# Patient Record
Sex: Male | Born: 1960
Health system: Southern US, Community
[De-identification: ages and names within clinical notes are randomized; demographics above are authoritative.]

## PROBLEM LIST (undated history)

## (undated) DIAGNOSIS — G510 Bell's palsy: Secondary | ICD-10-CM

## (undated) DIAGNOSIS — I10 Essential (primary) hypertension: Secondary | ICD-10-CM

## (undated) DIAGNOSIS — I219 Acute myocardial infarction, unspecified: Secondary | ICD-10-CM

## (undated) DIAGNOSIS — I2510 Atherosclerotic heart disease of native coronary artery without angina pectoris: Secondary | ICD-10-CM

## (undated) HISTORY — DX: Atherosclerotic heart disease of native coronary artery without angina pectoris: I25.10

## (undated) HISTORY — DX: Acute myocardial infarction, unspecified: I21.9

## (undated) HISTORY — PX: WISDOM TOOTH EXTRACTION: SHX21

---
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
## 1993-08-22 LAB — HM COLONOSCOPY

## 1999-07-19 ENCOUNTER — Ambulatory Visit (HOSPITAL_BASED_OUTPATIENT_CLINIC_OR_DEPARTMENT_OTHER): Admission: RE | Admit: 1999-07-19 | Discharge: 1999-07-19 | Payer: Self-pay | Admitting: General Surgery

## 1999-07-19 ENCOUNTER — Encounter (INDEPENDENT_AMBULATORY_CARE_PROVIDER_SITE_OTHER): Payer: Self-pay | Admitting: *Deleted

## 1999-08-23 HISTORY — PX: INGUINAL HERNIA REPAIR: SUR1180

## 2001-08-22 DIAGNOSIS — G510 Bell's palsy: Secondary | ICD-10-CM

## 2001-08-22 HISTORY — DX: Bell's palsy: G51.0

## 2008-09-11 ENCOUNTER — Encounter: Admission: RE | Admit: 2008-09-11 | Discharge: 2008-09-11 | Payer: Self-pay | Admitting: Family Medicine

## 2011-01-07 NOTE — Op Note (Signed)
[facility]  Patient:    ROBERTA KELLY                          MRN: [id number] Proc. Date: 07/19/99 Adm. Date:  [id number] Attending:  Brandy Hale CC:         Redmond Baseman, M.D.                           Operative Report  PREOPERATIVE DIAGNOSIS:  Right inguinal hernia.  POSTOPERATIVE DIAGNOSIS:  Right inguinal hernia.  OPERATION:  Repair right inguinal hernia with mesh (Lichteinstein repair).  SURGEON:  Angelia Mould. Derrell Lolling, M.D.  INDICATION:  This is a 50 year old white male who presents with a two-month history of some discomfort and a small bulge in his right groin.  Examination reveals a  small reducible right inguinal hernia and no other abnormalities.  There is no scrotal mass and there is no mass on the left inguinal area.  He is brought to he operating room for repair of his right inguinal hernia.  DESCRIPTION OF PROCEDURE:  The patient was placed supine on the operating table. The right groin was prepped and draped in a sterile fashion.  The patient was monitored and sedated by the anesthesia department.  Then 1% Xylocaine with epinephrine was used as a local infiltration anesthetic.  A transverse incision was made in the right inguinal area.  Dissection was carried down through the subcutaneous tissue exposing the aponeurosis of the external oblique.  The external oblique was incised in the direction of its fibers, opening up the external ring.  The external oblique was retracted.  The cord structures  were mobilized and encircled with a Penrose drain.  I dissected out a large lipoma of the cord and dissected this all the way back o the internal ring.  We skeletonized the lipoma somewhat, clamped the lipoma at he level of the internal ring, and amputated the lipoma.  We ligated the stump of he lipoma with 3-0 Vicryl tie.  We then dissected the cord structures away from what appears to be  a moderate-sized direct inguinal hernia.  We then examined very carefully for indirect hernia. There was no evidence of an indirect hernia.  In fact, with retraction, we actually saw the edge of the peritoneum below the level of the internal ring.  The floor of the inguinal canal was repaired and reinforced with an onlay graft of Marlex mesh.  The mesh was cut into and appropriate shape and sutured in place ith running sutures of 2-0 Prolene.  The mesh was sutured so as to generously overlap the fascia at the pubic tubercle, along the inguinal ligament inferiorly, and then along the conjoined tendons superiorly and then the mesh was incised laterally o as to wrap around the cord structures at the internal ring.  The tails of the mesh were overlapped laterally and the suture lines completed.  This provided a very secure repair both medial and lateral to the internal ring but allowed an adequate opening for the cord structures.  The wound was irrigated with saline.  Hemostasis was excellent.  The external oblique was closed with a running suture of 2-0 Vicryl placing the cord structures and the mesh deep to the external oblique.  The Scarpas fascia closed with 3-0 Vicryl and the skin closed with a running subcuticular suture of 4-0 Vicryl and Steri-Strips.  Clean bandages were  placed and the patient was taken to the recovery room in stable condition.  ESTIMATED BLOOD LOSS:  About 10 cc.  COMPLICATIONS:  None.  SPONGE/NEEDLE/INSTRUMENT COUNTS:  Correct.DD:  07/20/99 TD:  07/20/99 Job: [id number]

## 2012-07-13 ENCOUNTER — Encounter (HOSPITAL_BASED_OUTPATIENT_CLINIC_OR_DEPARTMENT_OTHER): Payer: Self-pay | Admitting: *Deleted

## 2012-07-13 ENCOUNTER — Emergency Department (HOSPITAL_BASED_OUTPATIENT_CLINIC_OR_DEPARTMENT_OTHER): Payer: No Typology Code available for payment source

## 2012-07-13 ENCOUNTER — Emergency Department (HOSPITAL_BASED_OUTPATIENT_CLINIC_OR_DEPARTMENT_OTHER)
Admission: EM | Admit: 2012-07-13 | Discharge: 2012-07-13 | Disposition: A | Discharge status: Home / Self Care | Payer: No Typology Code available for payment source | Attending: Emergency Medicine | Admitting: Emergency Medicine

## 2012-07-13 DIAGNOSIS — Y9389 Activity, other specified: Secondary | ICD-10-CM | POA: Insufficient documentation

## 2012-07-13 DIAGNOSIS — Y92410 Unspecified street and highway as the place of occurrence of the external cause: Secondary | ICD-10-CM | POA: Insufficient documentation

## 2012-07-13 DIAGNOSIS — S99929A Unspecified injury of unspecified foot, initial encounter: Secondary | ICD-10-CM | POA: Insufficient documentation

## 2012-07-13 DIAGNOSIS — I10 Essential (primary) hypertension: Secondary | ICD-10-CM | POA: Insufficient documentation

## 2012-07-13 DIAGNOSIS — S8990XA Unspecified injury of unspecified lower leg, initial encounter: Secondary | ICD-10-CM | POA: Insufficient documentation

## 2012-07-13 DIAGNOSIS — G510 Bell's palsy: Secondary | ICD-10-CM | POA: Insufficient documentation

## 2012-07-13 DIAGNOSIS — S0990XA Unspecified injury of head, initial encounter: Secondary | ICD-10-CM | POA: Insufficient documentation

## 2012-07-13 DIAGNOSIS — S161XXA Strain of muscle, fascia and tendon at neck level, initial encounter: Secondary | ICD-10-CM

## 2012-07-13 DIAGNOSIS — S139XXA Sprain of joints and ligaments of unspecified parts of neck, initial encounter: Secondary | ICD-10-CM | POA: Insufficient documentation

## 2012-07-13 HISTORY — DX: Essential (primary) hypertension: I10

## 2012-07-13 HISTORY — DX: Bell's palsy: G51.0

## 2012-07-13 MED ORDER — IBUPROFEN 800 MG PO TABS: 800.0000 mg | ORAL_TABLET | Freq: Three times a day (TID) | ORAL | Status: DC
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
## 2012-07-13 NOTE — ED Provider Notes (Signed)
Medical screening examination/treatment/procedure(s) were performed by non-physician practitioner and as supervising physician I was immediately available for consultation/collaboration.  Raeford Razor, MD 07/13/12 2004

## 2013-05-08 ENCOUNTER — Encounter: Payer: Self-pay | Admitting: Family Medicine

## 2013-05-08 ENCOUNTER — Ambulatory Visit (INDEPENDENT_AMBULATORY_CARE_PROVIDER_SITE_OTHER): Payer: BC Managed Care – PPO | Admitting: Family Medicine

## 2013-05-08 VITALS — BP 117/70 | HR 57 | Resp 16 | Ht 71.5 in | Wt 202.0 lb

## 2013-05-08 DIAGNOSIS — Z789 Other specified health status: Secondary | ICD-10-CM

## 2013-05-08 DIAGNOSIS — Z0000 Encounter for general adult medical examination without abnormal findings: Secondary | ICD-10-CM

## 2013-05-08 DIAGNOSIS — I10 Essential (primary) hypertension: Secondary | ICD-10-CM

## 2013-05-08 DIAGNOSIS — Z125 Encounter for screening for malignant neoplasm of prostate: Secondary | ICD-10-CM

## 2013-05-08 MED ORDER — LISINOPRIL 10 MG PO TABS: 10.0000 mg | ORAL_TABLET | Freq: Every day | ORAL | Status: DC

## 2013-05-08 NOTE — Progress Notes (Signed)
  Subjective:    Patient ID: Maurice Hernandez, male    DOB: Oct 16, 1960, 52 y.o.   MRN: [id number]  HPI  Maurice Hernandez is here today to establish care with our practice.  He found our name at the [facility].  He has been receiving care from Dr. Zachery Dauer at [facility].  He has been somewhat satisfied with his care and has decided to change providers.  He does not have any medical concerns today.     Review of Systems  Constitutional: Negative.   HENT: Negative.   Eyes: Negative.   Respiratory: Positive for cough.   Cardiovascular: Negative.   Gastrointestinal: Negative.   Endocrine: Negative.   Genitourinary: Negative.   Musculoskeletal: Negative.   Skin: Negative.   Allergic/Immunologic: Negative.   Neurological: Negative.   Hematological: Negative.   Psychiatric/Behavioral: Negative.     Past Medical History  Diagnosis Date  . Hypertension   . Bell's palsy 2003    Family History  Problem Relation Age of Onset  . COPD Mother     Non Smoker (16)   . Hypertension Mother   . Heart disease Father     CABG (3-4 vessel)   . Hypertension Sister   . Alzheimer's disease Paternal Aunt   . Alzheimer's disease Paternal Uncle   . Cancer Sister     Hodgkin's Disease (16); Breast Cancer (Bilateral Mastectomy)   . 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 Constitutional: He is oriented to person, place, and time. He appears well-developed and well-nourished.  HENT:  Head: Normocephalic.  Eyes: Conjunctivae  are normal. No scleral icterus.  Neck: Neck supple. No thyromegaly present.  Cardiovascular: Normal rate, regular rhythm and normal heart sounds.   Pulmonary/Chest: Effort normal and breath sounds normal.  Musculoskeletal: He exhibits no edema and no tenderness.  Lymphadenopathy:    He has no cervical adenopathy.  Neurological: He is alert and oriented to person, place, and time.  Skin: Skin is warm and dry. No rash noted.  Psychiatric: He has a normal mood and affect. His behavior is normal. Judgment and thought content normal.      Assessment & Plan:

## 2013-05-08 NOTE — Patient Instructions (Addendum)
1)  BP - Perfect on current medication.  If you feel that you cough too much we can always change from an ACE to ARB.   2)  Vaccinations - Flu, Tdap, Zostavax vs Varicella  3)  Labs - Next week then set up a CPE.    Preventive Care for Adults, Male A healthy lifestyle and preventive care can promote health and wellness. Preventive health guidelines for men include the following key practices:  A routine yearly physical is a good way to check with your caregiver about your health and preventative screening. It is a chance to share any concerns and updates on your health, and to receive a thorough exam.  Visit your dentist for a routine exam and preventative care every 6 months. Brush your teeth twice a day and floss once a day. Good oral hygiene prevents tooth decay and gum disease.  The frequency of eye exams is based on your age, health, family medical history, use of contact lenses, and other factors. Follow your caregiver's recommendations for frequency of eye exams.  Eat a healthy diet. Foods like vegetables, fruits, whole grains, low-fat dairy products, and lean protein foods contain the nutrients you need without too many calories. Decrease your intake of foods high in solid fats, added sugars, and salt. Eat the right amount of calories for you.Get information about a proper diet from your caregiver, if necessary.  Regular physical exercise is one of the most important things you can do for your health. Most adults should get at least 150 minutes of moderate-intensity exercise (any activity that increases your heart rate and causes you to sweat) each week. In addition, most adults need muscle-strengthening exercises on 2 or more days a week.  Maintain a healthy weight. The body mass index (BMI) is a screening tool to identify possible weight problems. It provides an estimate of body fat based on height and weight. Your caregiver can help determine your BMI, and can help you achieve or  maintain a healthy weight.For adults 20 years and older:  A BMI below 18.5 is considered underweight.  A BMI of 18.5 to 24.9 is normal.  A BMI of 25 to 29.9 is considered overweight.  A BMI of 30 and above is considered obese.  Maintain normal blood lipids and cholesterol levels by exercising and minimizing your intake of saturated fat. Eat a balanced diet with plenty of fruit and vegetables. Blood tests for lipids and cholesterol should begin at age 62 and be repeated every 5 years. If your lipid or cholesterol levels are high, you are over 50, or you are a high risk for heart disease, you may need your cholesterol levels checked more frequently.Ongoing high lipid and cholesterol levels should be treated with medicines if diet and exercise are not effective.  If you smoke, find out from your caregiver how to quit. If you do not use tobacco, do not start.  If you choose to drink alcohol, do not exceed 2 drinks per day. One drink is considered to be 12 ounces (355 mL) of beer, 5 ounces (148 mL) of wine, or 1.5 ounces (44 mL) of liquor.  Avoid use of street drugs. Do not share needles with anyone. Ask for help if you need support or instructions about stopping the use of drugs.  High blood pressure causes heart disease and increases the risk of stroke. Your blood pressure should be checked at least every 1 to 2 years. Ongoing high blood pressure should be treated with medicines,  if weight loss and exercise are not effective.  If you are 11 to 50 years old, ask your caregiver if you should take aspirin to prevent heart disease.  Diabetes screening involves taking a blood sample to check your fasting blood sugar level. This should be done once every 3 years, after age 103, if you are within normal weight and without risk factors for diabetes. Testing should be considered at a younger age or be carried out more frequently if you are overweight and have at least 1 risk factor for  diabetes.  Colorectal cancer can be detected and often prevented. Most routine colorectal cancer screening begins at the age of 70 and continues through age 68. However, your caregiver may recommend screening at an earlier age if you have risk factors for colon cancer. On a yearly basis, your caregiver may provide home test kits to check for hidden blood in the stool. Use of a small camera at the end of a tube, to directly examine the colon (sigmoidoscopy or colonoscopy), can detect the earliest forms of colorectal cancer. Talk to your caregiver about this at age 21, when routine screening begins. Direct examination of the colon should be repeated every 5 to 10 years through age 35, unless early forms of pre-cancerous polyps or small growths are found.  Hepatitis C blood testing is recommended for all people born from [birth year] through 1965 and any individual with known risks for hepatitis C.  Practice safe sex. Use condoms and avoid high-risk sexual practices to reduce the spread of sexually transmitted infections (STIs). STIs include gonorrhea, chlamydia, syphilis, trichomonas, herpes, HPV, and human immunodeficiency virus (HIV). Herpes, HIV, and HPV are viral illnesses that have no cure. They can result in disability, cancer, and death.  A one-time screening for abdominal aortic aneurysm (AAA) and surgical repair of large AAAs by sound wave imaging (ultrasonography) is recommended for ages 54 to 16 years who are current or former smokers.  Healthy men should no longer receive prostate-specific antigen (PSA) blood tests as part of routine cancer screening. Consult with your caregiver about prostate cancer screening.  Testicular cancer screening is not recommended for adult males who have no symptoms. Screening includes self-exam, caregiver exam, and other screening tests. Consult with your caregiver about any symptoms you have or any concerns you have about testicular cancer.  Use sunscreen with skin  protection factor (SPF) of 30 or more. Apply sunscreen liberally and repeatedly throughout the day. You should seek shade when your shadow is shorter than you. Protect yourself by wearing long sleeves, pants, a wide-brimmed hat, and sunglasses year round, whenever you are outdoors.  Once a month, do a whole body skin exam, using a mirror to look at the skin on your back. Notify your caregiver of new moles, moles that have irregular borders, moles that are larger than a pencil eraser, or moles that have changed in shape or color.  Stay current with required immunizations.  Influenza. You need a dose every fall (or winter). The composition of the flu vaccine changes each year, so being vaccinated once is not enough.  Pneumococcal polysaccharide. You need 1 to 2 doses if you smoke cigarettes or if you have certain chronic medical conditions. You need 1 dose at age 18 (or older) if you have never been vaccinated.  Tetanus, diphtheria, pertussis (Tdap, Td). Get 1 dose of Tdap vaccine if you are younger than age 25 years, are over 12 and have contact with an infant, are a Research scientist (physical sciences),  or simply want to be protected from whooping cough. After that, you need a Td booster dose every 10 years. Consult your caregiver if you have not had at least 3 tetanus and diphtheria-containing shots sometime in your life or have a deep or dirty wound.  HPV. This vaccine is recommended for males 13 through 50 years of age. This vaccine may be given to men 22 through 50 years of age who have not completed the 3 dose series. It is recommended for men through age 48 who have sex with men or whose immune system is weakened because of HIV infection, other illness, or medications. The vaccine is given in 3 doses over 6 months.  Measles, mumps, rubella (MMR). You need at least 1 dose of MMR if you were born in 1957 or later. You may also need a 2nd dose.  Meningococcal. If you are age 104 to 68 years and a Dealer living in a residence hall, or have one of several medical conditions, you need to get vaccinated against meningococcal disease. You may also need additional booster doses.  Zoster (shingles). If you are age 64 years or older, you should get this vaccine.  Varicella (chickenpox). If you have never had chickenpox or you were vaccinated but received only 1 dose, talk to your caregiver to find out if you need this vaccine.  Hepatitis A. You need this vaccine if you have a specific risk factor for hepatitis A virus infection, or you simply wish to be protected from this disease. The vaccine is usually given as 2 doses, 6 to 18 months apart.  Hepatitis B. You need this vaccine if you have a specific risk factor for hepatitis B virus infection or you simply wish to be protected from this disease. The vaccine is given in 3 doses, usually over 6 months. Preventative Service / Frequency Ages 87 to 67  Blood pressure check.** / Every 1 to 2 years.  Lipid and cholesterol check.** / Every 5 years beginning at age 13.  Hepatitis C blood test.** / For any individual with known risks for hepatitis C.  Skin self-exam. / Monthly.  Influenza immunization.** / Every year.  Pneumococcal polysaccharide immunization.** / 1 to 2 doses if you smoke cigarettes or if you have certain chronic medical conditions.  Tetanus, diphtheria, pertussis (Tdap,Td) immunization. / A one-time dose of Tdap vaccine. After that, you need a Td booster dose every 10 years.  HPV immunization. / 3 doses over 6 months, if 26 and younger.  Measles, mumps, rubella (MMR) immunization. / You need at least 1 dose of MMR if you were born in 1957 or later. You may also need a 2nd dose.  Meningococcal immunization. / 1 dose if you are age 41 to 26 years and a Orthoptist living in a residence hall, or have one of several medical conditions, you need to get vaccinated against meningococcal disease. You may also need  additional booster doses.  Varicella immunization.** / Consult your caregiver.  Hepatitis A immunization.** / Consult your caregiver. 2 doses, 6 to 18 months apart.  Hepatitis B immunization.** / Consult your caregiver. 3 doses usually over 6 months. Ages 74 to 25  Blood pressure check.** / Every 1 to 2 years.  Lipid and cholesterol check.** / Every 5 years beginning at age 54.  Fecal occult blood test (FOBT) of stool. / Every year beginning at age 72 and continuing until age 18. You may not have to do this test  if you get colonoscopy every 10 years.  Flexible sigmoidoscopy** or colonoscopy.** / Every 5 years for a flexible sigmoidoscopy or every 10 years for a colonoscopy beginning at age 62 and continuing until age 49.  Hepatitis C blood test.** / For all people born from [birth year] through 1965 and any individual with known risks for hepatitis C.  Skin self-exam. / Monthly.  Influenza immunization.** / Every year.  Pneumococcal polysaccharide immunization.** / 1 to 2 doses if you smoke cigarettes or if you have certain chronic medical conditions.  Tetanus, diphtheria, pertussis (Tdap/Td) immunization.** / A one-time dose of Tdap vaccine. After that, you need a Td booster dose every 10 years.  Measles, mumps, rubella (MMR) immunization. / You need at least 1 dose of MMR if you were born in 1957 or later. You may also need a 2nd dose.  Varicella immunization.**/ Consult your caregiver.  Meningococcal immunization.** / Consult your caregiver.  Hepatitis A immunization.** / Consult your caregiver. 2 doses, 6 to 18 months apart.  Hepatitis B immunization.** / Consult your caregiver. 3 doses, usually over 6 months. Ages 16 and over  Blood pressure check.** / Every 1 to 2 years.  Lipid and cholesterol check.**/ Every 5 years beginning at age 55.  Fecal occult blood test (FOBT) of stool. / Every year beginning at age 38 and continuing until age 8. You may not have to do this test if  you get colonoscopy every 10 years.  Flexible sigmoidoscopy** or colonoscopy.** / Every 5 years for a flexible sigmoidoscopy or every 10 years for a colonoscopy beginning at age 25 and continuing until age 32.  Hepatitis C blood test.** / For all people born from [birth year] through 1963 and any individual with known risks for hepatitis C.  Abdominal aortic aneurysm (AAA) screening.** / A one-time screening for ages 67 to 45 years who are current or former smokers.  Skin self-exam. / Monthly.  Influenza immunization.** / Every year.  Pneumococcal polysaccharide immunization.** / 1 dose at age 24 (or older) if you have never been vaccinated.  Tetanus, diphtheria, pertussis (Tdap, Td) immunization. / A one-time dose of Tdap vaccine if you are over 65 and have contact with an infant, are a Research scientist (physical sciences), or simply want to be protected from whooping cough. After that, you need a Td booster dose every 10 years.  Varicella immunization. ** / Consult your caregiver.  Meningococcal immunization.** / Consult your caregiver.  Hepatitis A immunization. ** / Consult your caregiver. 2 doses, 6 to 18 months apart.  Hepatitis B immunization.** / Check with your caregiver. 3 doses, usually over 6 months. **Family history and personal history of risk and condi  tions may change your caregiver's recommendations. Document Released: 10/04/2001 Document Revised: 10/31/2011 Document Reviewed: 01/03/2011 [facility] Patient Information 2014 [place], [place].

## 2013-05-17 ENCOUNTER — Other Ambulatory Visit: Payer: Self-pay | Admitting: *Deleted

## 2013-05-17 DIAGNOSIS — Z789 Other specified health status: Secondary | ICD-10-CM

## 2013-05-17 DIAGNOSIS — Z0000 Encounter for general adult medical examination without abnormal findings: Secondary | ICD-10-CM

## 2013-05-17 DIAGNOSIS — Z125 Encounter for screening for malignant neoplasm of prostate: Secondary | ICD-10-CM

## 2013-05-17 LAB — COMPLETE METABOLIC PANEL WITH GFR
ALT: 19 U/L (ref 0–53)
AST: 19 U/L (ref 0–37)
Albumin: 4.6 g/dL (ref 3.5–5.2)
Alkaline Phosphatase: 61 U/L (ref 39–117)
BUN: 21 mg/dL (ref 6–23)
CO2: 30 mEq/L (ref 19–32)
Calcium: 9.6 mg/dL (ref 8.4–10.5)
Chloride: 104 mEq/L (ref 96–112)
Creat: 1.13 mg/dL (ref 0.50–1.35)
GFR, Est African American: 87 mL/min
GFR, Est Non African American: 75 mL/min
Glucose, Bld: 89 mg/dL (ref 70–99)
Potassium: 4.3 mEq/L (ref 3.5–5.3)
Sodium: 139 mEq/L (ref 135–145)
Total Bilirubin: 0.8 mg/dL (ref 0.3–1.2)
Total Protein: 6.8 g/dL (ref 6.0–8.3)

## 2013-05-17 LAB — LIPID PANEL
Cholesterol: 213 mg/dL — ABNORMAL HIGH (ref 0–200)
HDL: 42 mg/dL (ref >39)
LDL Cholesterol: 147 mg/dL — ABNORMAL HIGH (ref 0–99)
Total CHOL/HDL Ratio: 5.1 Ratio
Triglycerides: 120 mg/dL (ref <150)
VLDL: 24 mg/dL (ref 0–40)

## 2013-05-17 LAB — PSA, TOTAL AND FREE
PSA, Free Pct: 35 % (ref >25)
PSA, Free: 0.13 ng/mL
PSA: 0.37 ng/mL (ref <=4.00)

## 2013-05-17 LAB — TSH: TSH: 2.34 u[IU]/mL (ref 0.350–4.500)

## 2013-05-18 LAB — VARICELLA ZOSTER ANTIBODY, IGG: Varicella IgG: 1923 Index — ABNORMAL HIGH (ref <135.00)

## 2013-06-06 ENCOUNTER — Encounter: Payer: BC Managed Care – PPO | Admitting: Family Medicine

## 2013-06-20 ENCOUNTER — Ambulatory Visit (INDEPENDENT_AMBULATORY_CARE_PROVIDER_SITE_OTHER): Payer: BC Managed Care – PPO | Admitting: Family Medicine

## 2013-06-20 ENCOUNTER — Encounter: Payer: Self-pay | Admitting: Family Medicine

## 2013-06-20 VITALS — BP 135/81 | HR 69 | Resp 16 | Ht 72.0 in | Wt 200.0 lb

## 2013-06-20 DIAGNOSIS — R5381 Other malaise: Secondary | ICD-10-CM

## 2013-06-20 DIAGNOSIS — I10 Essential (primary) hypertension: Secondary | ICD-10-CM

## 2013-06-20 DIAGNOSIS — E785 Hyperlipidemia, unspecified: Secondary | ICD-10-CM

## 2013-06-20 DIAGNOSIS — N529 Male erectile dysfunction, unspecified: Secondary | ICD-10-CM

## 2013-06-20 DIAGNOSIS — Z0000 Encounter for general adult medical examination without abnormal findings: Secondary | ICD-10-CM

## 2013-06-20 LAB — POCT URINALYSIS DIPSTICK
Bilirubin, UA: NEGATIVE
Blood, UA: NEGATIVE
Glucose, UA: NEGATIVE
Ketones, UA: NEGATIVE
Leukocytes, UA: NEGATIVE
Nitrite, UA: NEGATIVE
Protein, UA: NEGATIVE
Spec Grav, UA: 1.015
Urobilinogen, UA: NEGATIVE
pH, UA: 5

## 2013-06-20 LAB — EKG 12-LEAD

## 2013-06-20 MED ORDER — TADALAFIL 20 MG PO TABS: 20.0000 mg | ORAL_TABLET | Freq: Every day | ORAL | Status: DC | PRN

## 2013-06-20 MED ORDER — SILDENAFIL CITRATE 100 MG PO TABS: ORAL_TABLET | ORAL | Status: DC

## 2013-06-20 MED ORDER — LOSARTAN POTASSIUM 50 MG PO TABS: 50.0000 mg | ORAL_TABLET | Freq: Every day | ORAL | Status: DC

## 2013-06-20 MED ORDER — VARDENAFIL HCL 20 MG PO TABS: ORAL_TABLET | ORAL | Status: DC

## 2013-06-20 NOTE — Progress Notes (Signed)
Subjective:    Patient ID: Maurice Hernandez, male    DOB: 02/21/1961, 52 y.o.   MRN: [id number]  HPI  Maurice Hernandez is here today for his annual CPE.  Overall he feels that he is healthy. He would like to go over his most recent lab results and discuss a couple of conditions.     Review of Systems  Constitutional: Negative.   HENT: Negative.   Eyes: Negative.   Respiratory: Negative.   Cardiovascular: Negative.   Gastrointestinal: Negative.   Endocrine: Negative.   Genitourinary: Negative.   Musculoskeletal: Negative.   Skin: Negative.   Allergic/Immunologic: Negative.   Neurological: Negative.   Hematological: Negative.   Psychiatric/Behavioral: Negative.      Past Medical History  Diagnosis Date  . Hypertension   . Bell's palsy 2003      Family History  Problem Relation Age of Onset  . COPD Mother     Non Smoker (110)   . Hypertension Mother   . Heart disease Father     CABG (3-4 vessel)   . Hypertension Sister   . Alzheimer's disease Paternal Aunt   . Alzheimer's disease Paternal Uncle   . Cancer Sister     Hodgkin's Disease (16); Breast Cancer (Bilateral Mastectomy)   . 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 Constitutional: He is oriented to person, place, and time. He appears well-developed and well-nourished.  HENT:  Head: Normocephalic.  Eyes: Conjunctivae and EOM are normal. Pupils are equal, round, and reactive to light.  Neck: Normal range of motion.   Cardiovascular: Normal rate, normal heart sounds and intact distal pulses.   Pulmonary/Chest: Effort normal and breath sounds normal.  Abdominal: Soft. Bowel sounds are normal.  Genitourinary: Penis normal.  Musculoskeletal: Normal range of motion.  Neurological: He is alert and oriented to person, place, and time.  Skin: Skin is warm and dry.  Psychiatric: He has a normal mood and affect. His behavior is normal. Judgment and thought content normal.      Assessment & Plan:    Brahm was seen today for annual exam.  Diagnoses and associated orders for this visit:  Routine general medical examination at a health care facility Comments: We discussed preventative issues for his age.  He is to let us know if he wants to proceed with a colonoscopy.  We checked to see if he has had chicken pox hich he has so he'll check to see if his insurance covers the Zostavax.   - POCT urinalysis dipstick  Other malaise and fatigue Comments: His TSH and CBC were WNL.    Erectile dysfunction Comments: He is going to try several different medications for ED.   - sildenafil (VIAGRA) 100 MG  tablet; Take 1/2 - 1 tab as needed - vardenafil (LEVITRA) 20 MG tablet; Take 1/2 - 1 tab as needed - tadalafil (CIALIS) 20 MG tablet; Take 1 tablet (20 mg total) by mouth daily as needed for erectile dysfunction.  Essential hypertension, benign Comments: We'll change him from lisinopril to losartan to see if this not only helps get rid of his cough but help lessen his issue with ED as well.   - EKG 12-Lead - losartan (COZAAR) 50 MG tablet; Take 1 tablet (50 mg total) by mouth daily.  Other and unspecified hyperlipidemia Comments: He is going to work harder on his diet and exercise.

## 2013-06-20 NOTE — Patient Instructions (Addendum)
1)  Vaccinations - Check into getting the Tdap and Zostavax.    2)  Preventative - Colonoscopy    Preventive Care for Adults, Male A healthy lifestyle and preventive care can promote health and wellness. Preventive health guidelines for men include the following key practices:  A routine yearly physical is a good way to check with your caregiver about your health and preventative screening. It is a chance to share any concerns and updates on your health, and to receive a thorough exam.  Visit your dentist for a routine exam and preventative care every 6 months. Brush your teeth twice a day and floss once a day. Good oral hygiene prevents tooth decay and gum disease.  The frequency of eye exams is based on your age, health, family medical history, use of contact lenses, and other factors. Follow your caregiver's recommendations for frequency of eye exams.  Eat a healthy diet. Foods like vegetables, fruits, whole grains, low-fat dairy products, and lean protein foods contain the nutrients you need without too many calories. Decrease your intake of foods high in solid fats, added sugars, and salt. Eat the right amount of calories for you.Get information about a proper diet from your caregiver, if necessary.  Regular physical exercise is one of the most important things you can do for your health. Most adults should get at least 150 minutes of moderate-intensity exercise (any activity that increases your heart rate and causes you to sweat) each week. In addition, most adults need muscle-strengthening exercises on 2 or more days a week.  Maintain a healthy weight. The body mass index (BMI) is a screening tool to identify possible weight problems. It provides an estimate of body fat based on height and weight. Your caregiver can help determine your BMI, and can help you achieve or maintain a healthy weight.For adults 20 years and older:  A BMI below 18.5 is considered underweight.  A BMI of 18.5 to  24.9 is normal.  A BMI of 25 to 29.9 is considered overweight.  A BMI of 30 and above is considered obese.  Maintain normal blood lipids and cholesterol levels by exercising and minimizing your intake of saturated fat. Eat a balanced diet with plenty of fruit and vegetables. Blood tests for lipids and cholesterol should begin at age 14 and be repeated every 5 years. If your lipid or cholesterol levels are high, you are over 50, or you are a high risk for heart disease, you may need your cholesterol levels checked more frequently.Ongoing high lipid and cholesterol levels should be treated with medicines if diet and exercise are not effective.  If you smoke, find out from your caregiver how to quit. If you do not use tobacco, do not start.  If you choose to drink alcohol, do not exceed 2 drinks per day. One drink is considered to be 12 ounces (355 mL) of beer, 5 ounces (148 mL) of wine, or 1.5 ounces (44 mL) of liquor.  Avoid use of street drugs. Do not share needles with anyone. Ask for help if you need support or instructions about stopping the use of drugs.  High blood pressure causes heart disease and increases the risk of stroke. Your blood pressure should be checked at least every 1 to 2 years. Ongoing high blood pressure should be treated with medicines, if weight loss and exercise are not effective.  If you are 38 to 50 years old, ask your caregiver if you should take aspirin to prevent heart disease.  Diabetes screening involves taking a blood sample to check your fasting blood sugar level. This should be done once every 3 years, after age 53, if you are within normal weight and without risk factors for diabetes. Testing should be considered at a younger age or be carried out more frequently if you are overweight and have at least 1 risk factor for diabetes.  Colorectal cancer can be detected and often prevented. Most routine colorectal cancer screening begins at the age of 50 and  continues through age 38. However, your caregiver may recommend screening at an earlier age if you have risk factors for colon cancer. On a yearly basis, your caregiver may provide home test kits to check for hidden blood in the stool. Use of a small camera at the end of a tube, to directly examine the colon (sigmoidoscopy or colonoscopy), can detect the earliest forms of colorectal cancer. Talk to your caregiver about this at age 4, when routine screening begins. Direct examination of the colon should be repeated every 5 to 10 years through age 34, unless early forms of pre-cancerous polyps or small growths are found.  Hepatitis C blood testing is recommended for all people born from [birth year] through 1965 and any individual with known risks for hepatitis C.  Practice safe sex. Use condoms and avoid high-risk sexual practices to reduce the spread of sexually transmitted infections (STIs). STIs include gonorrhea, chlamydia, syphilis, trichomonas, herpes, HPV, and human immunodeficiency virus (HIV). Herpes, HIV, and HPV are viral illnesses that have no cure. They can result in disability, cancer, and death.  A one-time screening for abdominal aortic aneurysm (AAA) and surgical repair of large AAAs by sound wave imaging (ultrasonography) is recommended for ages 57 to 10 years who are current or former smokers.  Healthy men should no longer receive prostate-specific antigen (PSA) blood tests as part of routine cancer screening. Consult with your caregiver about prostate cancer screening.  Testicular cancer screening is not recommended for adult males who have no symptoms. Screening includes self-exam, caregiver exam, and other screening tests. Consult with your caregiver about any symptoms you have or any concerns you have about testicular cancer.  Use sunscreen with skin protection factor (SPF) of 30 or more. Apply sunscreen liberally and repeatedly throughout the day. You should seek shade when your shadow  is shorter than you. Protect yourself by wearing long sleeves, pants, a wide-brimmed hat, and sunglasses year round, whenever you are outdoors.  Once a month, do a whole body skin exam, using a mirror to look at the skin on your back. Notify your caregiver of new moles, moles that have irregular borders, moles that are larger than a pencil eraser, or moles that have changed in shape or color.  Stay current with required immunizations.  Influenza. You need a dose every fall (or winter). The composition of the flu vaccine changes each year, so being vaccinated once is not enough.  Pneumococcal polysaccharide. You need 1 to 2 doses if you smoke cigarettes or if you have certain chronic medical conditions. You need 1 dose at age 64 (or older) if you have never been vaccinated.  Tetanus, diphtheria, pertussis (Tdap, Td). Get 1 dose of Tdap vaccine if you are younger than age 45 years, are over 35 and have contact with an infant, are a Research scientist (physical sciences), or simply want to be protected from whooping cough. After that, you need a Td booster dose every 10 years. Consult your caregiver if you have not had at least  3 tetanus and diphtheria-containing shots sometime in your life or have a deep or dirty wound.  HPV. This vaccine is recommended for males 13 through 50 years of age. This vaccine may be given to men 22 through 50 years of age who have not completed the 3 dose series. It is recommended for men through age 62 who have sex with men or whose immune system is weakened because of HIV infection, other illness, or medications. The vaccine is given in 3 doses over 6 months.  Measles, mumps, rubella (MMR). You need at least 1 dose of MMR if you were born in 1957 or later. You may also need a 2nd dose.  Meningococcal. If you are age 52 to 65 years and a Orthoptist living in a residence hall, or have one of several medical conditions, you need to get vaccinated against meningococcal disease. You  may also need additional booster doses.  Zoster (shingles). If you are age 41 years or older, you should get this vaccine.  Varicella (chickenpox). If you have never had chickenpox or you were vaccinated but received only 1 dose, talk to your caregiver to find out if you need this vaccine.  Hepatitis A. You need this vaccine if you have a specific risk factor for hepatitis A virus infection, or you simply wish to be protected from this disease. The vaccine is usually given as 2 doses, 6 to 18 months apart.  Hepatitis B. You need this vaccine if you have a specific risk factor for hepatitis B virus infection or you simply wish to be protected from this disease. The vaccine is given in 3 doses, usually over 6 months. Preventative Service / Frequency Ages 70 to 55  Blood pressure check.** / Every 1 to 2 years.  Lipid and cholesterol check.** / Every 5 years beginning at age 40.  Hepatitis C blood test.** / For any individual with known risks for hepatitis C.  Skin self-exam. / Monthly.  Influenza immunization.** / Every year.  Pneumococcal polysaccharide immunization.** / 1 to 2 doses if you smoke cigarettes or if you have certain chronic medical conditions.  Tetanus, diphtheria, pertussis (Tdap,Td) immunization. / A one-time dose of Tdap vaccine. After that, you need a Td booster dose every 10 years.  HPV immunization. / 3 doses over 6 months, if 26 and younger.  Measles, mumps, rubella (MMR) immunization. / You need at least 1 dose of MMR if you were born in 1957 or later. You may also need a 2nd dose.  Meningococcal immunization. / 1 dose if you are age 45 to 30 years and a Orthoptist living in a residence hall, or have one of several medical conditions, you need to get vaccinated against meningococcal disease. You may also need additional booster doses.  Varicella immunization.** / Consult your caregiver.  Hepatitis A immunization.** / Consult your caregiver. 2  doses, 6 to 18 months apart.  Hepatitis B immunization.** / Consult your caregiver. 3 doses usually over 6 months. Ages 43 to 59  Blood pressure check.** / Every 1 to 2 years.  Lipid and cholesterol check.** / Every 5 years beginning at age 25.  Fecal occult blood test (FOBT) of stool. / Every year beginning at age 63 and continuing until age 9. You may not have to do this test if you get colonoscopy every 10 years.  Flexible sigmoidoscopy** or colonoscopy.** / Every 5 years for a flexible sigmoidoscopy or every 10 years for a colonoscopy beginning at age  50 and continuing until age 57.  Hepatitis C blood test.** / For all people born from [birth year] through 1965 and any individual with known risks for hepatitis C.  Skin self-exam. / Monthly.  Influenza immunization.** / Every year.  Pneumococcal polysaccharide immunization.** / 1 to 2 doses if you smoke cigarettes or if you have certain chronic medical conditions.  Tetanus, diphtheria, pertussis (Tdap/Td) immunization.** / A one-time dose of Tdap vaccine. After that, you need a Td booster dose every 10 years.  Measles, mumps, rubella (MMR) immunization. / You need at least 1 dose of MMR if you were born in 1957 or later. You may also need a 2nd dose.  Varicella immunization.**/ Consult your caregiver.  Meningococcal immunization.** / Consult your caregiver.  Hepatitis A immunization.** / Consult your caregiver. 2 doses, 6 to 18 months apart.  Hepatitis B immunization.** / Consult your caregiver. 3 doses, usually over 6 months. Ages 29 and over  Blood pressure check.** / Every 1 to 2 years.  Lipid and cholesterol check.**/ Every 5 years beginning at age 89.  Fecal occult blood test (FOBT) of stool. / Every year beginning at age 66 and continuing until age 64. You may not have to do this test if you get colonoscopy every 10 years.  Flexible sigmoidoscopy** or colonoscopy.** / Every 5 years for a flexible sigmoidoscopy or every 10  years for a colonoscopy beginning at age 59 and continuing until age 25.  Hepatitis C blood test.** / For all people born from [birth year] through 1965 and any individual with known risks for hepatitis C.  Abdominal aortic aneurysm (AAA) screening.** / A one-time screening for ages 67 to 38 years who are current or former smokers.  Skin self-exam. / Monthly.  Influenza immunization.** / Every year.  Pneumococcal polysaccharide immunization.** / 1 dose at age 59 (or older) if you have never been vaccinated.  Tetanus, diphtheria, pertussis (Tdap, Td) immunization. / A one-time dose of Tdap vaccine if you are over 65 and have contact with an infant, are a Research scientist (physical sciences), or simply want to be protected from whooping cough. After that, you need a Td booster dose every 10 years.  Varicella immunization. ** / Consult your caregiver.  Meningococcal immunization.** / Consult your caregiver.  Hepatitis A immunization. ** / Consult your caregiver. 2 doses, 6 to 18 months apart.  Hepatitis B immunization.** / Check with your caregiver. 3 doses, usually over 6 months. **Family history and personal history of risk and conditions may change your caregiver's recommendations. Document Released: 10/04/2001 Document Revised: 10/31/2011 Document Reviewed: 01/03/2011 [facility] Patient Information 2014 [place], [place].

## 2013-07-20 ENCOUNTER — Encounter: Payer: Self-pay | Admitting: Family Medicine

## 2013-07-20 DIAGNOSIS — Z0000 Encounter for general adult medical examination without abnormal findings: Secondary | ICD-10-CM | POA: Insufficient documentation

## 2013-07-20 DIAGNOSIS — Z789 Other specified health status: Secondary | ICD-10-CM | POA: Insufficient documentation

## 2013-07-20 DIAGNOSIS — Z125 Encounter for screening for malignant neoplasm of prostate: Secondary | ICD-10-CM | POA: Insufficient documentation

## 2013-07-20 DIAGNOSIS — I10 Essential (primary) hypertension: Secondary | ICD-10-CM | POA: Insufficient documentation

## 2013-07-20 NOTE — Assessment & Plan Note (Signed)
He has a mild cough which may be due to his ACE (lisinopril).  He will keep an eye on this.  We may switch him to an ARB.

## 2013-07-20 NOTE — Assessment & Plan Note (Signed)
Checking a PSA 

## 2013-07-20 NOTE — Assessment & Plan Note (Signed)
He is unsure if he has ever had chicken pox or not.  We'll check a Varicella Titer.

## 2013-07-29 ENCOUNTER — Encounter: Payer: Self-pay | Admitting: Family Medicine

## 2013-07-30 ENCOUNTER — Encounter: Payer: Self-pay | Admitting: *Deleted

## 2013-08-24 DIAGNOSIS — N529 Male erectile dysfunction, unspecified: Secondary | ICD-10-CM | POA: Insufficient documentation

## 2013-08-24 DIAGNOSIS — R5383 Other fatigue: Secondary | ICD-10-CM

## 2013-08-24 DIAGNOSIS — R5381 Other malaise: Secondary | ICD-10-CM | POA: Insufficient documentation

## 2013-08-24 NOTE — Addendum Note (Signed)
Addended by: Ival Bible on: 08/24/2013 05:49 PM   Modules accepted: Level of Service

## 2013-10-21 ENCOUNTER — Ambulatory Visit (INDEPENDENT_AMBULATORY_CARE_PROVIDER_SITE_OTHER): Payer: No Typology Code available for payment source | Admitting: *Deleted

## 2013-10-21 DIAGNOSIS — R5383 Other fatigue: Secondary | ICD-10-CM

## 2013-10-21 DIAGNOSIS — Z2911 Encounter for prophylactic immunotherapy for respiratory syncytial virus (RSV): Secondary | ICD-10-CM

## 2013-10-21 DIAGNOSIS — Z23 Encounter for immunization: Secondary | ICD-10-CM

## 2013-10-21 DIAGNOSIS — R5381 Other malaise: Secondary | ICD-10-CM

## 2013-10-21 NOTE — Assessment & Plan Note (Signed)
He received his Boostrix without difficulty.  He was given a handout discussing possible side effects.  

## 2014-02-23 ENCOUNTER — Other Ambulatory Visit: Payer: Self-pay | Admitting: Family Medicine

## 2014-02-27 ENCOUNTER — Other Ambulatory Visit: Payer: Self-pay | Admitting: *Deleted

## 2014-02-27 DIAGNOSIS — I10 Essential (primary) hypertension: Secondary | ICD-10-CM

## 2014-02-27 MED ORDER — LISINOPRIL 10 MG PO TABS: 10.0000 mg | ORAL_TABLET | Freq: Every day | ORAL | Status: DC

## 2014-03-19 ENCOUNTER — Ambulatory Visit: Payer: No Typology Code available for payment source | Admitting: Family Medicine

## 2014-05-06 ENCOUNTER — Ambulatory Visit: Payer: No Typology Code available for payment source | Admitting: Physician Assistant

## 2014-05-06 DIAGNOSIS — Z0289 Encounter for other administrative examinations: Secondary | ICD-10-CM

## 2014-05-21 ENCOUNTER — Ambulatory Visit (INDEPENDENT_AMBULATORY_CARE_PROVIDER_SITE_OTHER): Payer: No Typology Code available for payment source | Admitting: Physician Assistant

## 2014-05-21 ENCOUNTER — Encounter: Payer: Self-pay | Admitting: Physician Assistant

## 2014-05-21 VITALS — BP 127/87 | HR 55 | Temp 97.9°F | Resp 16 | Ht 72.0 in | Wt 190.5 lb

## 2014-05-21 DIAGNOSIS — Z113 Encounter for screening for infections with a predominantly sexual mode of transmission: Secondary | ICD-10-CM | POA: Diagnosis not present

## 2014-05-21 DIAGNOSIS — I10 Essential (primary) hypertension: Secondary | ICD-10-CM

## 2014-05-21 DIAGNOSIS — Z1211 Encounter for screening for malignant neoplasm of colon: Secondary | ICD-10-CM | POA: Diagnosis not present

## 2014-05-21 MED ORDER — LISINOPRIL 10 MG PO TABS: 10.0000 mg | ORAL_TABLET | Freq: Every day | ORAL | Status: DC

## 2014-05-21 NOTE — Assessment & Plan Note (Signed)
Will order comprehensive STD panel.

## 2014-05-21 NOTE — Patient Instructions (Signed)
Please go to the lab for blood work.  I will call you with your results.  Continue medications as directed.  You will be contacted by GI for screening colonoscopy. Remember to follow-up for your physical in December.  Welcome to [facility]!

## 2014-05-21 NOTE — Assessment & Plan Note (Signed)
Referral placed to GI for screening colonoscopy for colon cancer screening.

## 2014-05-21 NOTE — Progress Notes (Signed)
Pre visit review using our clinic review tool, if applicable. No additional management support is needed unless otherwise documented below in the visit note/SLS  

## 2014-05-21 NOTE — Assessment & Plan Note (Signed)
Well controlled at present. Please continue mediations as directed.  Limit salt intake.

## 2014-05-21 NOTE — Progress Notes (Signed)
Patient presents to clinic today to establish care.  Acute Concerns: Patient requesting routine STD screening.  Just separated from long-time girlfriend and is concerned of possibly infidelity.  Denies symptoms of history of STD.  Is not currently sexually active.  Chronic Issues: Hypertension -- Endorses controlled with Lisinopril 10 mg daily.  BP 127/87 in clinic today.  Asymptomatic.  Is exercising daily and has lost 15 pounds.  Erectile Dysfunction -- previously on Levitra and Cialis with good effect.  Denies lightheadedness or dizziness.  Feels that Cialis worked the best.  Does not wish to have refill of medication at present.  Health Maintenance: Dental -- up-to-date Vision -- up-to-date Immunizations -- Up-to-date on required immunizations.  Declines flu shot at today's visit. Colonoscopy -- last in 1995; polyps noted.  Needs repeat colonoscopy.  Past Medical History  Diagnosis Date  . Hypertension   . Bell's palsy 2003    Past Surgical History  Procedure Laterality Date  . Inguinal hernia repair Left 1968  . Inguinal hernia repair Right 2001    Current Outpatient Prescriptions on File Prior to Visit  Medication Sig Dispense Refill  . tadalafil (CIALIS) 20 MG tablet Take 1 tablet (20 mg total) by mouth daily as needed for erectile dysfunction.  10 tablet  0   No current facility-administered medications on file prior to visit.    No Known Allergies  Family History  Problem Relation Age of Onset  . COPD Mother     Non Smoker (27)   . Hypertension Mother   . Heart disease Father     CABG (3-4 vessel)   . Hypertension Sister   . Alzheimer's disease Paternal Aunt   . Alzheimer's disease Paternal Uncle   . Cancer Sister     Hodgkin's Disease (16); Breast Cancer (Bilateral Mastectomy)   . Hypertension Sister     History   Social History  . Marital Status: Divorced    Spouse Name: N/A    Number of Children: N/A  . Years of Education: N/A   Occupational  History  . CPA      [employer]   Social History Main Topics  . Smoking status: Never Smoker   . Smokeless tobacco: Never Used  . Alcohol Use: 1.5 oz/week    3 drink(s) per week     Comment: Ocasionally   . Drug Use: No  . Sexual Activity: Not Currently   Other Topics Concern  . 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  All other ROS are negative.  BP 127/87  Pulse 55  Temp(Src) 97.9 F (36.6 C) (Oral)  Resp 16  Ht 6' (1.829 m)  Wt 190 lb 8 oz (86.41 kg)  BMI 25.83 kg/m2  SpO2 100%  Physical Exam  Vitals reviewed. Constitutional: He is oriented to person, place, and  time and well-developed, well-nourished, and in no distress.  HENT:  Head: Normocephalic and atraumatic.  Right Ear: External ear normal.  Left Ear: External ear normal.  Nose: Nose normal.  Mouth/Throat: Oropharynx is clear and moist. No oropharyngeal exudate.  TM within normal limits bilaterally.  Eyes: Conjunctivae are normal. Pupils are equal, round, and reactive to light.  Neck: Neck supple.  Cardiovascular: Normal rate, regular rhythm, normal heart sounds and intact distal pulses.   Pulmonary/Chest: Effort normal and breath sounds normal. No respiratory distress. He has no wheezes. He has no rales. He exhibits no tenderness.  Abdominal: Soft. Bowel sounds are normal. He exhibits no distension and no mass. There is no tenderness. There is no rebound and no guarding.  Lymphadenopathy:    He has no cervical adenopathy.  Neurological: He is alert and oriented to person, place, and time.    Skin: Skin is warm and dry. No rash noted.  Psychiatric: Affect normal.   Assessment/Plan: Essential hypertension, benign Well controlled at present. Please continue mediations as directed.  Limit salt intake.  Screening for STD (sexually transmitted disease) Will order comprehensive STD panel.    Colon cancer screening Referral placed to GI for screening colonoscopy for colon cancer screening.

## 2014-05-22 LAB — HSV(HERPES SMPLX)ABS-I+II(IGG+IGM)-BLD
HERPES SIMPLEX VRS I-IGM AB (EIA): 0.23 {index}
HSV 1 Glycoprotein G Ab, IgG: 7.16 IV — ABNORMAL HIGH

## 2014-05-22 LAB — HIV ANTIBODY (ROUTINE TESTING W REFLEX): HIV 1&2 Ab, 4th Generation: NONREACTIVE

## 2014-05-22 LAB — ACUTE HEP PANEL AND HEP B SURFACE AB
HCV Ab: NEGATIVE
HEP A IGM: NONREACTIVE
HEP B C IGM: NONREACTIVE
Hep B S Ab: POSITIVE — AB
Hepatitis B Surface Ag: NEGATIVE

## 2014-05-22 LAB — GC/CHLAMYDIA PROBE AMP, URINE
Chlamydia, Swab/Urine, PCR: NEGATIVE
GC Probe Amp, Urine: NEGATIVE

## 2014-05-22 LAB — RPR

## 2014-05-30 ENCOUNTER — Encounter: Payer: Self-pay | Admitting: Internal Medicine

## 2014-07-03 ENCOUNTER — Ambulatory Visit (AMBULATORY_SURGERY_CENTER): Payer: Self-pay | Admitting: *Deleted

## 2014-07-03 VITALS — Ht 73.0 in | Wt 199.2 lb

## 2014-07-03 DIAGNOSIS — Z1211 Encounter for screening for malignant neoplasm of colon: Secondary | ICD-10-CM

## 2014-07-03 MED ORDER — MOVIPREP 100 G PO SOLR: ORAL | Status: DC

## 2014-07-03 NOTE — Progress Notes (Signed)
No egg or soy allergy  No anesthesia or intubation problems per pt  No diet medications taken  Registered in EMMI   

## 2014-07-21 ENCOUNTER — Telehealth: Payer: Self-pay | Admitting: Physician Assistant

## 2014-07-21 NOTE — Telephone Encounter (Signed)
Caller name: Maurice Hernandez, [place] Relation to pt: self  Call back number: [phone]   Reason for call:  Pt is requesting a refill lisinopril (PRINIVIL,ZESTRIL) 10 MG tablet

## 2014-07-21 NOTE — Telephone Encounter (Signed)
Medication Detail      Disp Refills Start End     lisinopril (PRINIVIL,ZESTRIL) 10 MG tablet 90 tablet 0 05/21/2014 05/21/2015    Sig - Route: Take 1 tablet (10 mg total) by mouth daily. - Oral    E-Prescribing Status: Receipt confirmed by pharmacy (05/21/2014 8:50 AM EDT)     Associated Diagnoses    Essential hypertension, benign - Primary     Pharmacy    [facility] [place], [address]   Informed patient that he should not be due for medication refill for 30 days, he is not sure if he filled another px prior; will call pharmacy to see if 09.30.15 #90 Rx is still available/SLS

## 2014-07-25 ENCOUNTER — Ambulatory Visit (INDEPENDENT_AMBULATORY_CARE_PROVIDER_SITE_OTHER): Payer: BC Managed Care – PPO | Admitting: Physician Assistant

## 2014-07-25 ENCOUNTER — Encounter: Payer: Self-pay | Admitting: Physician Assistant

## 2014-07-25 VITALS — BP 151/89 | HR 64 | Temp 97.9°F | Wt 200.0 lb

## 2014-07-25 DIAGNOSIS — J029 Acute pharyngitis, unspecified: Secondary | ICD-10-CM | POA: Insufficient documentation

## 2014-07-25 LAB — POCT RAPID STREP A (OFFICE): RAPID STREP A SCREEN: NEGATIVE

## 2014-07-25 NOTE — Progress Notes (Signed)
Patient presents to clinic today c/o sore throat that began last.  Endorses dry and painful throat with swallowing.  Throat is sore bilaterally.  Denies cough, chills, nasal congestion, fever, chills, aches or fatigue.  Was at the gym yesterday and was exposed to very dusty atmosphere.  Past Medical History  Diagnosis Date  . Hypertension   . Bell's palsy 2003    Current Outpatient Prescriptions on File Prior to Visit  Medication Sig Dispense Refill  . lisinopril (PRINIVIL,ZESTRIL) 10 MG tablet Take 1 tablet (10 mg total) by mouth daily. 90 tablet 0  . MOVIPREP 100 G SOLR moviprep as directed, no substitutions 1 kit 0  . Multiple Vitamin (MULTIVITAMIN) tablet Take 1 tablet by mouth every other day.    . tadalafil (CIALIS) 20 MG tablet Take 1 tablet (20 mg total) by mouth daily as needed for erectile dysfunction. 10 tablet 0   No current facility-administered medications on file prior to visit.    No Known Allergies  Family History  Problem Relation Age of Onset  . COPD Mother     Non Smoker (52)   . Hypertension Mother   . Heart disease Father     CABG (3-4 vessel)   . Hypertension Sister   . Alzheimer's disease Paternal Aunt   . Alzheimer's disease Paternal Uncle   . Cancer Sister     Hodgkin's Disease (16); Breast Cancer (Bilateral Mastectomy)   . Hypertension Sister   . Colon cancer Neg Hx   . Esophageal cancer Neg Hx   . Stomach cancer Neg Hx   . Rectal cancer Neg Hx     History   Social History  . Marital Status: Divorced    Spouse Name: N/A    Number of Children: N/A  . Years of Education: N/A   Occupational History  . CPA      [employer]   Social History Main Topics  . Smoking status: Never Smoker   . Smokeless tobacco: Never Used  . Alcohol Use: 1.5 oz/week    3 Not specified per week     Comment: Ocasionally   . Drug Use: No  . Sexual Activity: Not Currently   Other Topics Concern  . 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  All other ROS are negative.  BP 151/89 mmHg  Pulse 64  Temp(Src) 97.9 F (36.6 C)  Wt 200 lb (90.719 kg)  SpO2 99%  Physical Exam  Constitutional: He is oriented to person, place, and time and well-developed, well-nourished, and in no distress.  HENT:  Head: Normocephalic and atraumatic.  Right Ear: Tympanic membrane, external ear and ear canal normal.  Left Ear: Tympanic membrane, external ear and ear canal normal.  Nose: Nose normal.  Mouth/Throat: Uvula is midline, oropharynx is clear and moist and mucous  membranes are normal. No oropharyngeal exudate, posterior oropharyngeal edema, posterior oropharyngeal erythema or tonsillar abscesses.  TM within normal limits bilaterally.  Eyes: Conjunctivae are normal.  Neck: Neck supple.  Cardiovascular: Normal rate, regular rhythm, normal heart sounds and intact distal pulses.   Pulmonary/Chest: Effort normal and breath sounds normal. No respiratory distress. He has no wheezes. He has no rales. He exhibits no tenderness.  Lymphadenopathy:    He has no cervical adenopathy.  Neurological: He is alert and oriented to person, place, and time.  Skin: Skin is warm and dry. No rash noted.  Psychiatric: Affect normal.  Vitals reviewed.   Recent Results (from the past 2160 hour(s))  HIV antibody (with reflex)     Status: None   Collection Time: 05/21/14  8:58 AM  Result Value Ref Range   HIV 1&2 Ab, 4th Generation NONREACTIVE NONREACTIVE    Comment:   A NONREACTIVE HIV Ag/Ab result does not exclude HIV infection since the time frame  for seroconversion is variable. If acute HIV infection is suspected, a HIV-1 RNA Qualitative TMA test is recommended.   HIV-1/2 Antibody Diff         Not indicated. HIV-1 RNA, Qual TMA           Not indicated.   PLEASE NOTE: This information has been disclosed to you from records whose confidentiality may be protected by state law. If your state requires such protection, then the state law prohibits you from making any further disclosure of the information without the specific written consent of the person to whom it pertains, or as otherwise permitted by law. A general authorization for the release of medical or other information is NOT sufficient for this purpose.   The performance of this assay has not been clinically validated in patients less than 37 years old.  RPR     Status: None   Collection Time: 05/21/14  8:58 AM  Result Value Ref Range   RPR NON REAC NON REAC  GC/chlamydia probe amp, urine     Status: None   Collection Time: 05/21/14  8:58 AM  Result Value Ref Range   Chlamydia, Swab/Urine, PCR NEGATIVE NEGATIVE    Comment:                      **Normal Reference Range: Negative**          Assay performed using the Gen-Probe APTIMA COMBO2 (R) Assay.     GC Probe Amp, Urine NEGATIVE NEGATIVE    Comment:                      **Normal Reference Range: Negative**          Assay performed using the Gen-Probe APTIMA COMBO2 (R) Assay.    Acute Hep Panel & Hep B Surface Ab     Status: Abnormal   Collection Time: 05/21/14  8:58 AM  Result Value Ref Range   Hepatitis B Surface Ag NEGATIVE NEGATIVE   Hep B C IgM NON REACTIVE NON REACTIVE    Comment: High levels of Hepatitis B Core IgM antibody are detectable during the acute stage of Hepatitis B. This antibody is used to differentiate current from past HBV infection.     Hep B S Ab POS (A) NEGATIVE   Hep A IgM NON REACTIVE NON REACTIVE   HCV Ab NEGATIVE NEGATIVE    Comment:   Effective July 07, 2014, Hepatitis C  Antibody (test code [phone]) will  be revised to automatically reflex to the Hepatitis C Viral RNA, Quantitative, Real-Time PCR assay if the antibody screening result is Reactive. This action is being taken to ensure that the CDC/USPSTF recommended HCV diagnostic algorithm with the appropriate test reflex needed for accurate interpretation is followed.   As of July 07, 2014 the name of the test code 23620 will be Hepatitis C Antibody with Reflex to HCV RNA, Quantitative, Real-Time PCR. This change will also be reflected in standard and custom profiles that currently include test code 23620.    HSV(herpes smplx)abs-1+2(IgG+IgM)-bld     Status: Abnormal   Collection Time: 05/21/14  8:58 AM  Result Value Ref Range   HSV 1 Glycoprotein G Ab, IgG 7.16 (H) IV    Comment:      IV = Index Value              < 0.90 IV              Negative              0.90-1.10 IV           Equivocal              > 1.10 IV              Positive   HSV 2 Glycoprotein G Ab, IgG <0.10 IV    Comment:      IV = Index Value              < 0.90 IV              Negative              0.90-1.10 IV           Equivocal              > 1.10 IV              Positive   Herpes Simplex Vrs I&II-IgM Ab (EIA) 0.23 INDEX    Comment:                 <=0.90 . . . . . . [name] NEGATIVE               0.91-1.09. . . . . [name] EQUIVOCAL               >=1.10 . . . . . . [name] POSITIVE                                                                                                   The results obtained with the HSV 1  an aid to diagnosis and should not be interpreted as diagnostic by themselves. Heterotypic IgM antibody responses may occur in patients with a history of infection with other Herpes viruses, including Epstein-Barr virus and Varicella zoster virus, and give false positive results in HSV 1  between HSV 1 and HSV 2.  A positive HSV IgM may indicate primary infection, but IgM antibody can persist 12 or more months after primary  infection.  For confirmation,  if clinically indicated, positive IgM results could be followed with the test for HSV 1  glycoprotein (test code [phone]) in 4-6 weeks.  POCT rapid strep A     Status: Normal   Collection Time: 07/25/14  9:04 AM  Result Value Ref Range   Rapid Strep A Screen Negative Negative    Assessment/Plan: Sore throat Rapid strep negative. Highly suspect viral etiology.  Increase fluids.  Salt-eater gargles. Alternate tylenol and ibuprofen as needed for throat pain.  Humidifier in bedroom.

## 2014-07-25 NOTE — Progress Notes (Signed)
Pre visit review using our clinic review tool, if applicable. No additional management support is needed unless otherwise documented below in the visit note. 

## 2014-07-25 NOTE — Assessment & Plan Note (Signed)
Rapid strep negative. Highly suspect viral etiology.  Increase fluids.  Salt-eater gargles. Alternate tylenol and ibuprofen as needed for throat pain.  Humidifier in bedroom.

## 2014-07-25 NOTE — Patient Instructions (Signed)
Your strep test is negative.  Your symptoms and exam seem consistent with a viral sore throat.  Increase your fluid intake.  Place a humidifier in the bedroom. Salt-water gargles will also be beneficial. Alternate tylenol and ibuprofen for throat pain.  If symptoms are not improving, or new symptoms develop, please call or return to office.

## 2014-08-04 ENCOUNTER — Encounter: Payer: No Typology Code available for payment source | Admitting: Internal Medicine

## 2014-08-26 ENCOUNTER — Ambulatory Visit (AMBULATORY_SURGERY_CENTER): Payer: Self-pay

## 2014-08-26 VITALS — Ht 73.0 in | Wt 198.0 lb

## 2014-08-26 DIAGNOSIS — Z1211 Encounter for screening for malignant neoplasm of colon: Secondary | ICD-10-CM

## 2014-08-26 MED ORDER — MOVIPREP 100 G PO SOLR: 1.0000 | Freq: Once | ORAL | Status: DC

## 2014-08-26 NOTE — Progress Notes (Signed)
Per pt, no allergies to soy or egg products.Pt not taking any weight loss meds or using  O2 at home. 

## 2014-09-02 ENCOUNTER — Encounter: Payer: Self-pay | Admitting: Internal Medicine

## 2014-09-08 ENCOUNTER — Telehealth: Payer: Self-pay | Admitting: Physician Assistant

## 2014-09-08 DIAGNOSIS — N529 Male erectile dysfunction, unspecified: Secondary | ICD-10-CM

## 2014-09-08 MED ORDER — TADALAFIL 20 MG PO TABS: 20.0000 mg | ORAL_TABLET | Freq: Every day | ORAL | Status: DC | PRN

## 2014-09-08 NOTE — Telephone Encounter (Signed)
Caller name: adyen Relation to pt: self Call back number: [phone] Pharmacy: [facility] on [address]  Reason for call:   Requesting cialis refill

## 2014-09-08 NOTE — Telephone Encounter (Signed)
Refill sent to pharmacy.   

## 2014-09-09 ENCOUNTER — Encounter: Payer: No Typology Code available for payment source | Admitting: Internal Medicine

## 2014-09-11 ENCOUNTER — Ambulatory Visit (AMBULATORY_SURGERY_CENTER): Payer: BLUE CROSS/BLUE SHIELD | Admitting: Internal Medicine

## 2014-09-11 ENCOUNTER — Encounter: Payer: Self-pay | Admitting: Internal Medicine

## 2014-09-11 VITALS — BP 106/68 | HR 61 | Temp 96.9°F | Resp 19 | Ht 73.0 in | Wt 198.0 lb

## 2014-09-11 DIAGNOSIS — Z1211 Encounter for screening for malignant neoplasm of colon: Secondary | ICD-10-CM

## 2014-09-11 DIAGNOSIS — D123 Benign neoplasm of transverse colon: Secondary | ICD-10-CM

## 2014-09-11 DIAGNOSIS — D122 Benign neoplasm of ascending colon: Secondary | ICD-10-CM

## 2014-09-11 DIAGNOSIS — D120 Benign neoplasm of cecum: Secondary | ICD-10-CM

## 2014-09-11 MED ORDER — SODIUM CHLORIDE 0.9 % IV SOLN: 500.0000 mL | INTRAVENOUS | Status: DC

## 2014-09-11 NOTE — Op Note (Signed)
[facility]  [address]. [place], [address]   COLONOSCOPY PROCEDURE REPORT  PATIENT: Maurice Hernandez, Maurice Hernandez  MR#: [id number] BIRTHDATE: 12-Jun-1961 , 53  yrs. old GENDER: male ENDOSCOPIST: Jerene Bears, MD REFERRED BY: Raiford Noble, PA-C PROCEDURE DATE:  09/11/2014 PROCEDURE:   Colonoscopy with snare polypectomy First Screening Colonoscopy - Avg.  risk and is 50 yrs.  old or older Yes.  Prior Negative Screening - Now for repeat screening. N/A  History of Adenoma - Now for follow-up colonoscopy & has been > or = to 3 yrs.  N/A  Polyps Removed Today? Yes. ASA CLASS:   Class II INDICATIONS:average risk for colon cancer and first colonoscopy. MEDICATIONS: Monitored anesthesia care and Propofol 300 mg IV  DESCRIPTION OF PROCEDURE:   After the risks benefits and alternatives of the procedure were thoroughly explained, informed consent was obtained.  The digital rectal exam revealed no rectal mass.   The [id number]  endoscope was introduced through the anus and advanced to the cecum, which was identified by both the appendix and ileocecal valve. No adverse events experienced. The quality of the prep was good, using MoviPrep  The instrument was then slowly withdrawn as the colon was fully examined.  COLON FINDINGS: Five sessile polyps ranging from 3 to 54mm in size with mucous caps were found.  Polypectomies were performed with a cold snare.  The resection was complete, the polyp tissue was completely retrieved and sent to histology.   The examination was otherwise normal.  Retroflexed views revealed internal hemorrhoids. The time to cecum=3 minutes 23 seconds.  Withdrawal time=11 minutes 59 seconds.  The scope was withdrawn and the procedure completed. COMPLICATIONS: There were no immediate complications.  ENDOSCOPIC IMPRESSION: 1.   Five sessile polyps ranging from 3 to 22mm in size were found; polypectomies were performed with a cold snare 2.   The examination  was otherwise normal  RECOMMENDATIONS: 1.  Avoid all NSAIDS for the next 2 weeks. 2.  Await pathology results 3.  If the polyps removed today are proven to be adenomatous (pre-cancerous) polyps, you will need a colonoscopy in 3 years. Otherwise you should continue to follow colorectal cancer screening guidelines for "routine risk" patients with a colonoscopy in 10 years.  You will receive a letter within 1-2 weeks with the results of your biopsy as well as final recommendations.  Please call my office if you have not received a letter after 3 weeks.  eSigned:  Jerene Bears, MD 09/11/2014 2:08 PM   cc: The Patient, Raiford Noble, PA-C

## 2014-09-11 NOTE — Progress Notes (Signed)
Report to PACU, RN, vss, BBS= Clear.  

## 2014-09-11 NOTE — Patient Instructions (Signed)
Discharge instructions given. Handout on polyps. Resume previous medications. Avoid all NSAIDS for the next 2 weeks. Your may notice some irritation in your nose or some drainage.  This may cause feelings of congestion.  This is from the oxygen, which can be very drying.  There is no need for concern, this should clear up in a day or so YOU HAD AN ENDOSCOPIC PROCEDURE TODAY AT [facility]: Refer to the procedure report that was given to you for any specific questions about what was found during the examination.  If the procedure report does not answer your questions, please call your gastroenterologist to clarify.  If you requested that your care partner not be given the details of your procedure findings, then the procedure report has been included in a sealed envelope for you to review at your convenience later.  YOU SHOULD EXPECT: Some feelings of bloating in the abdomen. Passage of more gas than usual.  Walking can help get rid of the air that was put into your GI tract during the procedure and reduce the bloating. If you had a lower endoscopy (such as a colonoscopy or flexible sigmoidoscopy) you may notice spotting of blood in your stool or on the toilet paper. If you underwent a bowel prep for your procedure, then you may not have a normal bowel movement for a few days.  DIET: Your first meal following the procedure should be a light meal and then it is ok to progress to your normal diet.  A half-sandwich or bowl of soup is an example of a good first meal.  Heavy or fried foods are harder to digest and may make you feel nauseous or bloated.  Likewise meals heavy in dairy and vegetables can cause extra gas to form and this can also increase the bloating.  Drink plenty of fluids but you should avoid alcoholic beverages for 24 hours.  ACTIVITY: Your care partner should take you home directly after the procedure.  You should plan to take it easy, moving slowly for the rest of the day.   You can resume normal activity the day after the procedure however you should NOT DRIVE or use heavy machinery for 24 hours (because of the sedation medicines used during the test).    SYMPTOMS TO REPORT IMMEDIATELY: A gastroenterologist can be reached at any hour.  During normal business hours, 8:30 AM to 5:00 PM Monday through Friday, call [phone].  After hours and on weekends, please call the GI answering service at [phone] who will take a message and have the physician on call contact you.   Following lower endoscopy (colonoscopy or flexible sigmoidoscopy):  Excessive amounts of blood in the stool  Significant tenderness or worsening of abdominal pains  Swelling of the abdomen that is new, acute  Fever of 100F or higher  FOLLOW UP: If any biopsies were taken you will be contacted by phone or by letter within the next 1-3 weeks.  Call your gastroenterologist if you have not heard about the biopsies in 3 weeks.  Our staff will call the home number listed on your records the next business day following your procedure to check on you and address any questions or concerns that you may have at that time regarding the information given to you following your procedure. This is a courtesy call and so if there is no answer at the home number and we have not heard from you through the emergency physician on call, we will  assume that you have returned to your regular daily activities without incident.  SIGNATURES/CONFIDENTIALITY: You and/or your care partner have signed paperwork which will be entered into your electronic medical record.  These signatures attest to the fact that that the information above on your After Visit Summary has been reviewed and is understood.  Full responsibility of the confidentiality of this discharge information lies with you and/or your care-partner.

## 2014-09-11 NOTE — Progress Notes (Signed)
Called to room to assist during endoscopic procedure.  Patient ID and intended procedure confirmed with present staff. Received instructions for my participation in the procedure from the performing physician.  

## 2014-09-15 ENCOUNTER — Telehealth: Payer: Self-pay | Admitting: *Deleted

## 2014-09-15 NOTE — Telephone Encounter (Signed)
  Follow up Call-  Call back number 09/11/2014  Post procedure Call Back phone  # [phone]  Permission to leave phone message Yes     Patient questions:  Do you have a fever, pain , or abdominal swelling? No. Pain Score  0 *  Have you tolerated food without any problems? Yes.    Have you been able to return to your normal activities? Yes.    Do you have any questions about your discharge instructions: Diet   No. Medications  No. Follow up visit  No.  Do you have questions or concerns about your Care? No.  Actions: * If pain score is 4 or above: No action needed, pain <4.

## 2014-09-19 ENCOUNTER — Encounter: Payer: Self-pay | Admitting: Internal Medicine

## 2014-09-26 ENCOUNTER — Telehealth: Payer: Self-pay | Admitting: *Deleted

## 2014-09-26 NOTE — Telephone Encounter (Signed)
Prior authorization for Cialis initiated. Awaiting determination from [facility]. JG//CMA

## 2014-10-15 ENCOUNTER — Telehealth: Payer: Self-pay | Admitting: Physician Assistant

## 2014-10-15 NOTE — Telephone Encounter (Signed)
LMOM with contact name and number [for return call, if needed] RE: Tadalafil the "generic" for Cialis and that the family of 'ED' medications all come at the same high cost; informed that some patients choose to only fill partial Rx and that if there were any further advice that I could find for him that I would call with it; provider informed/SLS

## 2014-10-15 NOTE — Telephone Encounter (Signed)
Caller name: mauro Relation to pt: self Call back number: [phone] Pharmacy: [address]  Reason for call:   Patient state that cialis is too expensive for him and is requesting that we send in the generic.

## 2014-10-20 NOTE — Telephone Encounter (Signed)
Patient returning phone call. [phone]. Patient wants to know if we would know the difference in price???

## 2014-10-20 NOTE — Telephone Encounter (Signed)
LMOM with contact name and number for return call RE:  Patient will need to check with his Insurance for pricing on 'ED' medications/SLS

## 2014-10-27 NOTE — Telephone Encounter (Signed)
Caller name:Maurice Hernandez Relationship to patient:self Can be reached:[phone] Pharmacy: [facility] on [address] in [place]  Reason for call: Pt still inquiring about generic version of cialis- Pt did research and found active ingredient in Viagra "sildenafil"- the pharmacy listed about has them available. Requesting that [place] send in [place]

## 2014-10-28 NOTE — Telephone Encounter (Signed)
Approved for 4 tabs per 30 days from 09/26/2014-08/21/2038.

## 2014-10-29 MED ORDER — SILDENAFIL CITRATE 100 MG PO TABS: 50.0000 mg | ORAL_TABLET | Freq: Every day | ORAL | Status: DC | PRN

## 2014-10-29 NOTE — Addendum Note (Signed)
Addended by: Raiford Noble on: 10/29/2014 09:32 AM   Modules accepted: Orders

## 2014-10-29 NOTE — Telephone Encounter (Signed)
Rx sent 

## 2014-11-14 ENCOUNTER — Telehealth: Payer: Self-pay | Admitting: Physician Assistant

## 2014-11-17 NOTE — Telephone Encounter (Signed)
Patient scheduled cpe for late April but is requesting that a 90 day supply be sent in instead of a 30 day supply. He states that his insurance covers better at 90 days

## 2014-11-17 NOTE — Telephone Encounter (Signed)
Medication Detail      Disp Refills Start End     lisinopril (PRINIVIL,ZESTRIL) 10 MG tablet 90 tablet 0 05/21/2014 05/21/2015    Sig - Route: Take 1 tablet (10 mg total) by mouth daily. - Oral    E-Prescribing Status: Receipt confirmed by pharmacy (05/21/2014 8:50 AM EDT)      Requested drug refills are authorized, 30-day supply Only, however, the patient needs further evaluation and/or laboratory testing before further refills are given. Ask him to make an appointment for this. Please call patient and schedule CPE with fasting labs [was due for in December 2015] prior to future refills/SLS Thanks.

## 2014-11-18 NOTE — Telephone Encounter (Signed)
This has already been addressed --  he will not be getting a 90-day supply until he is seen for his visit.  After he is seen, I have no problem sending in 90-day supply.  He is well overdue for a follow-up (was supposed to be seen in December), and has been getting refills despite being overdue.

## 2014-11-18 NOTE — Telephone Encounter (Signed)
As per pharmacy did not receive 90 day supply pt inquiring of the status.

## 2014-11-19 ENCOUNTER — Telehealth: Payer: Self-pay | Admitting: Physician Assistant

## 2014-11-19 NOTE — Telephone Encounter (Signed)
LM advising pt of PA instructions.

## 2014-11-19 NOTE — Telephone Encounter (Signed)
pre visit letter sent °

## 2014-12-09 ENCOUNTER — Telehealth: Payer: Self-pay | Admitting: *Deleted

## 2014-12-09 NOTE — Telephone Encounter (Signed)
Pre visit completed 

## 2014-12-10 ENCOUNTER — Encounter: Payer: Self-pay | Admitting: Physician Assistant

## 2014-12-10 ENCOUNTER — Ambulatory Visit (INDEPENDENT_AMBULATORY_CARE_PROVIDER_SITE_OTHER): Payer: BLUE CROSS/BLUE SHIELD | Admitting: Physician Assistant

## 2014-12-10 VITALS — BP 118/76 | HR 60 | Temp 98.0°F | Resp 16 | Ht 73.0 in | Wt 189.5 lb

## 2014-12-10 DIAGNOSIS — I10 Essential (primary) hypertension: Secondary | ICD-10-CM

## 2014-12-10 DIAGNOSIS — M722 Plantar fascial fibromatosis: Secondary | ICD-10-CM | POA: Diagnosis not present

## 2014-12-10 DIAGNOSIS — Z0000 Encounter for general adult medical examination without abnormal findings: Secondary | ICD-10-CM

## 2014-12-10 DIAGNOSIS — Z136 Encounter for screening for cardiovascular disorders: Secondary | ICD-10-CM | POA: Diagnosis not present

## 2014-12-10 DIAGNOSIS — Z0001 Encounter for general adult medical examination with abnormal findings: Secondary | ICD-10-CM | POA: Insufficient documentation

## 2014-12-10 DIAGNOSIS — M67919 Unspecified disorder of synovium and tendon, unspecified shoulder: Secondary | ICD-10-CM | POA: Insufficient documentation

## 2014-12-10 DIAGNOSIS — M67912 Unspecified disorder of synovium and tendon, left shoulder: Secondary | ICD-10-CM

## 2014-12-10 DIAGNOSIS — N529 Male erectile dysfunction, unspecified: Secondary | ICD-10-CM

## 2014-12-10 DIAGNOSIS — Z125 Encounter for screening for malignant neoplasm of prostate: Secondary | ICD-10-CM

## 2014-12-10 LAB — PSA: PSA: 0.46 ng/mL (ref 0.10–4.00)

## 2014-12-10 LAB — COMPREHENSIVE METABOLIC PANEL
ALBUMIN: 4.4 g/dL (ref 3.5–5.2)
ALT: 17 U/L (ref 0–53)
AST: 21 U/L (ref 0–37)
Alkaline Phosphatase: 61 U/L (ref 39–117)
BUN: 22 mg/dL (ref 6–23)
CO2: 28 mEq/L (ref 19–32)
CREATININE: 1.09 mg/dL (ref 0.40–1.50)
Calcium: 9.6 mg/dL (ref 8.4–10.5)
Chloride: 104 mEq/L (ref 96–112)
GFR: 75.61 mL/min (ref >60.00)
GLUCOSE: 85 mg/dL (ref 70–99)
POTASSIUM: 4.3 meq/L (ref 3.5–5.1)
SODIUM: 136 meq/L (ref 135–145)
Total Bilirubin: 0.9 mg/dL (ref 0.2–1.2)
Total Protein: 6.6 g/dL (ref 6.0–8.3)

## 2014-12-10 LAB — URINALYSIS, ROUTINE W REFLEX MICROSCOPIC
Bilirubin Urine: NEGATIVE
HGB URINE DIPSTICK: NEGATIVE
KETONES UR: NEGATIVE
LEUKOCYTES UA: NEGATIVE
Nitrite: NEGATIVE
RBC / HPF: NONE SEEN (ref 0–?)
Specific Gravity, Urine: >=1.03 — AB (ref 1.000–1.030)
Total Protein, Urine: NEGATIVE
URINE GLUCOSE: NEGATIVE
Urobilinogen, UA: 0.2 (ref 0.0–1.0)
WBC UA: NONE SEEN (ref 0–?)
pH: 5.5 (ref 5.0–8.0)

## 2014-12-10 LAB — CBC
HCT: 43.2 % (ref 39.0–52.0)
HEMOGLOBIN: 14.8 g/dL (ref 13.0–17.0)
MCHC: 34.3 g/dL (ref 30.0–36.0)
MCV: 90.2 fl (ref 78.0–100.0)
Platelets: 178 10*3/uL (ref 150.0–400.0)
RBC: 4.79 Mil/uL (ref 4.22–5.81)
RDW: 13.3 % (ref 11.5–15.5)
WBC: 3.9 10*3/uL — AB (ref 4.0–10.5)

## 2014-12-10 LAB — LIPID PANEL
Cholesterol: 204 mg/dL — ABNORMAL HIGH (ref 0–200)
HDL: 40.6 mg/dL (ref >39.00)
LDL Cholesterol: 134 mg/dL — ABNORMAL HIGH (ref 0–99)
NonHDL: 163.4
TRIGLYCERIDES: 147 mg/dL (ref 0.0–149.0)
Total CHOL/HDL Ratio: 5
VLDL: 29.4 mg/dL (ref 0.0–40.0)

## 2014-12-10 LAB — TSH: TSH: 2.11 u[IU]/mL (ref 0.35–4.50)

## 2014-12-10 LAB — HEMOGLOBIN A1C: Hgb A1c MFr Bld: 5.5 % (ref 4.6–6.5)

## 2014-12-10 MED ORDER — MELOXICAM 15 MG PO TABS: 15.0000 mg | ORAL_TABLET | Freq: Every day | ORAL | Status: DC

## 2014-12-10 MED ORDER — LISINOPRIL 10 MG PO TABS: 10.0000 mg | ORAL_TABLET | Freq: Every day | ORAL | Status: DC

## 2014-12-10 NOTE — Assessment & Plan Note (Signed)
Depression screen performed and negative. Patient up-to-date on immunizations. Is up-to-date on health maintenance. Preventative health care discussed with patient. Will obtain fasting labs today to include PSA.

## 2014-12-10 NOTE — Progress Notes (Signed)
Patient presents to clinic today for annual exam.  Patient is fasting for labs.  Acute Concerns: Patient c/o left shoulder pain over the past couple of weeks.  Denies trauma or injury.  Denies weakness, numbness or tingling.  Denies decreased ROM but there is pain with ROM.  Patient also c/o pain in left heel when walking over the past couple of months.  Denies trauma, injury or swelling.  Chronic Issues: Hypertension -- Patient endorses taking lisinopril daily.  Patient denies chest pain, palpitations, lightheadedness, dizziness, vision changes or frequent headaches.  Denies chronic cough with ACEI use.  Erectile Dysfunction -- Endorses good relief with Viagra on PRN basis.  Denies lightheadedness with medication.  Health Maintenance: Dental -- up-to-date Vision -- up-to-date Immunizations -- up-to-date per patient Colonoscopy -- 08/2014 -- normal.  Past Medical History  Diagnosis Date  . Hypertension   . Bell's palsy 2003    Past Surgical History  Procedure Laterality Date  . Inguinal hernia repair Left 1968  . Inguinal hernia repair Right 2001  . Wisdom tooth extraction      Current Outpatient Prescriptions on File Prior to Visit  Medication Sig Dispense Refill  . Multiple Vitamin (MULTIVITAMIN) tablet Take 1 tablet by mouth every other day.    . sildenafil (VIAGRA) 100 MG tablet Take 0.5-1 tablets (50-100 mg total) by mouth daily as needed for erectile dysfunction. (Patient taking differently: Take 20 mg by mouth daily as needed for erectile dysfunction. ) 10 tablet 0   No current facility-administered medications on file prior to visit.    No Known Allergies  Family History  Problem Relation Age of Onset  . COPD Mother     Non Smoker (60)   . Hypertension Mother   . Heart disease Father     CABG (3-4 vessel)   . Hypertension Sister   . Alzheimer's disease Paternal Aunt   . Alzheimer's disease Paternal Uncle   . Cancer Sister     Hodgkin's Disease (16);  Breast Cancer (Bilateral Mastectomy)   . Hypertension Sister   . Colon cancer Neg Hx   . Esophageal cancer Neg Hx   . Stomach cancer Neg Hx   . Rectal cancer Neg Hx   . Diabetes Neg Hx     History   Social History  . Marital Status: Divorced    Spouse Name: N/A  . Number of Children: N/A  . Years of Education: N/A   Occupational History  . CPA      [employer]   Social History Main Topics  . Smoking status: Never Smoker   . Smokeless tobacco: Never Used  . Alcohol Use: 1.8 oz/week    3 Cans of beer per week     Comment: Ocasionally   . Drug Use: No  . Sexual Activity: Not Currently   Other Topics Concern  . 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  HENT: Negative for ear discharge, ear pain, hearing loss and tinnitus.   Eyes: Negative for blurred vision, double vision, photophobia and pain.  Respiratory: Negative for cough and shortness of breath.   Cardiovascular: Negative for chest pain and palpitations.  Gastrointestinal: Negative for heartburn, nausea, vomiting, abdominal pain, diarrhea, constipation, blood in stool and melena.  Genitourinary: Negative for dysuria, urgency, frequency, hematuria and flank pain.  Musculoskeletal: Positive for joint pain. Negative for falls.  Neurological: Negative for dizziness, loss of consciousness and headaches.  Endo/Heme/Allergies: Negative for environmental allergies.  Psychiatric/Behavioral: Negative for depression, suicidal ideas, hallucinations and  substance abuse. The patient is not nervous/anxious and does not have insomnia.    BP 118/76 mmHg  Pulse 60  Temp(Src) 98 F (36.7 C) (Oral)  Resp 16  Ht 6\' 1"  (1.854 m)  Wt 189 lb 8 oz (85.957 kg)  BMI 25.01 kg/m2  SpO2 97%  Physical Exam  Constitutional: He is oriented to person, place, and time and well-developed, well-nourished, and in no distress.  HENT:  Head: Normocephalic and atraumatic.  Right Ear: External ear normal.  Left Ear: External ear normal.  Nose: Nose normal.  Mouth/Throat: Oropharynx is clear and moist. No oropharyngeal exudate.  TM within normal limits bilaterally.  Eyes: Conjunctivae are normal. Pupils are equal, round, and reactive to light.  Neck: Neck supple.  Cardiovascular: Normal rate, regular rhythm, normal heart sounds and intact distal pulses.   Pulmonary/Chest: Effort normal and breath sounds normal. No respiratory distress. He has no wheezes. He has no rales. He exhibits no tenderness.  Abdominal: Soft. Bowel sounds are normal. He exhibits no distension and no mass. There is no tenderness. There is no rebound and no guarding.  Neurological: He is alert and oriented to person, place, and time.  Skin: Skin is warm and dry. No rash noted.  Psychiatric: Affect normal.  Vitals reviewed.    Assessment/Plan: Erectile dysfunction Stable. We'll continue current medication regimen. Patient declines refill at present time.   Essential hypertension, benign Stable. Asymptomatic. Will continue lisinopril as directed. Will check CMP today. Will follow-up every 6 months.   Routine general medical examination at a health care facility Depression screen performed and negative. Patient up-to-date on immunizations. Is up-to-date on health maintenance. Preventative health care discussed with patient. Will obtain fasting labs today to include PSA.   Screening for prostate cancer Asymptomatic. Will obtain PSA per patient agreement.   Screening, ischemic  heart disease EKG without concerning findings. Will obtain lipid panel today.   Tendinopathy of rotator cuff Rx Mobic. Supportive measures discussed. Alternate heat and ice. Patient to follow-up with orthopedics if symptoms aren't improving.   Plantar fasciitis of left foot Rx Mobic. Night splint encouraged. Supportive footwear encouraged. "Cold can" exercise as discussed with patient. Follow-up if symptoms not improving.

## 2014-12-10 NOTE — Assessment & Plan Note (Signed)
Stable. We'll continue current medication regimen. Patient declines refill at present time.

## 2014-12-10 NOTE — Assessment & Plan Note (Signed)
Asymptomatic. Will obtain PSA per patient agreement.

## 2014-12-10 NOTE — Assessment & Plan Note (Signed)
Stable. Asymptomatic. Will continue lisinopril as directed. Will check CMP today. Will follow-up every 6 months.

## 2014-12-10 NOTE — Progress Notes (Signed)
Pre visit review using our clinic review tool, if applicable. No additional management support is needed unless otherwise documented below in the visit note/SLS  

## 2014-12-10 NOTE — Assessment & Plan Note (Signed)
Rx Mobic. Night splint encouraged. Supportive footwear encouraged. "Cold can" exercise as discussed with patient. Follow-up if symptoms not improving.

## 2014-12-10 NOTE — Assessment & Plan Note (Signed)
Rx Mobic. Supportive measures discussed. Alternate heat and ice. Patient to follow-up with orthopedics if symptoms aren't improving.

## 2014-12-10 NOTE — Patient Instructions (Signed)
Please go to the lab for blood work. I will call you with your results.  Continue your chronic medications as directed.  For shoulder -- take Mobic daily with food.  Limit heavy lifting.  You can apply topical Aspercreme to the area.    For the heel -- try to see if you can get a Night Splint from a local medical supply store.  Wear at night. Take Mobic again as directed. Wear supportive footwear.  Follow-up if symptoms are not improving.  Preventive Care for Adults A healthy lifestyle and preventive care can promote health and wellness. Preventive health guidelines for men include the following key practices:  A routine yearly physical is a good way to check with your health care provider about your health and preventative screening. It is a chance to share any concerns and updates on your health and to receive a thorough exam.  Visit your dentist for a routine exam and preventative care every 6 months. Brush your teeth twice a day and floss once a day. Good oral hygiene prevents tooth decay and gum disease.  The frequency of eye exams is based on your age, health, family medical history, use of contact lenses, and other factors. Follow your health care provider's recommendations for frequency of eye exams.  Eat a healthy diet. Foods such as vegetables, fruits, whole grains, low-fat dairy products, and lean protein foods contain the nutrients you need without too many calories. Decrease your intake of foods high in solid fats, added sugars, and salt. Eat the right amount of calories for you.Get information about a proper diet from your health care provider, if necessary.  Regular physical exercise is one of the most important things you can do for your health. Most adults should get at least 150 minutes of moderate-intensity exercise (any activity that increases your heart rate and causes you to sweat) each week. In addition, most adults need muscle-strengthening exercises on 2 or more days a  week.  Maintain a healthy weight. The body mass index (BMI) is a screening tool to identify possible weight problems. It provides an estimate of body fat based on height and weight. Your health care provider can find your BMI and can help you achieve or maintain a healthy weight.For adults 20 years and older:  A BMI below 18.5 is considered underweight.  A BMI of 18.5 to 24.9 is normal.  A BMI of 25 to 29.9 is considered overweight.  A BMI of 30 and above is considered obese.  Maintain normal blood lipids and cholesterol levels by exercising and minimizing your intake of saturated fat. Eat a balanced diet with plenty of fruit and vegetables. Blood tests for lipids and cholesterol should begin at age 34 and be repeated every 5 years. If your lipid or cholesterol levels are high, you are over 50, or you are at high risk for heart disease, you may need your cholesterol levels checked more frequently.Ongoing high lipid and cholesterol levels should be treated with medicines if diet and exercise are not working.  If you smoke, find out from your health care provider how to quit. If you do not use tobacco, do not start.  Lung cancer screening is recommended for adults aged 71-80 years who are at high risk for developing lung cancer because of a history of smoking. A yearly low-dose CT scan of the lungs is recommended for people who have at least a 30-pack-year history of smoking and are a current smoker or have quit within the  past 15 years. A pack year of smoking is smoking an average of 1 pack of cigarettes a day for 1 year (for example: 1 pack a day for 30 years or 2 packs a day for 15 years). Yearly screening should continue until the smoker has stopped smoking for at least 15 years. Yearly screening should be stopped for people who develop a health problem that would prevent them from having lung cancer treatment.  If you choose to drink alcohol, do not have more than 2 drinks per day. One drink  is considered to be 12 ounces (355 mL) of beer, 5 ounces (148 mL) of wine, or 1.5 ounces (44 mL) of liquor.  Avoid use of street drugs. Do not share needles with anyone. Ask for help if you need support or instructions about stopping the use of drugs.  High blood pressure causes heart disease and increases the risk of stroke. Your blood pressure should be checked at least every 1-2 years. Ongoing high blood pressure should be treated with medicines, if weight loss and exercise are not effective.  If you are 56-21 years old, ask your health care provider if you should take aspirin to prevent heart disease.  Diabetes screening involves taking a blood sample to check your fasting blood sugar level. This should be done once every 3 years, after age 54, if you are within normal weight and without risk factors for diabetes. Testing should be considered at a younger age or be carried out more frequently if you are overweight and have at least 1 risk factor for diabetes.  Colorectal cancer can be detected and often prevented. Most routine colorectal cancer screening begins at the age of 23 and continues through age 15. However, your health care provider may recommend screening at an earlier age if you have risk factors for colon cancer. On a yearly basis, your health care provider may provide home test kits to check for hidden blood in the stool. Use of a small camera at the end of a tube to directly examine the colon (sigmoidoscopy or colonoscopy) can detect the earliest forms of colorectal cancer. Talk to your health care provider about this at age 24, when routine screening begins. Direct exam of the colon should be repeated every 5-10 years through age 42, unless early forms of precancerous polyps or small growths are found.  People who are at an increased risk for hepatitis B should be screened for this virus. You are considered at high risk for hepatitis B if:  You were born in a country where hepatitis  B occurs often. Talk with your health care provider about which countries are considered high risk.  Your parents were born in a high-risk country and you have not received a shot to protect against hepatitis B (hepatitis B vaccine).  You have HIV or AIDS.  You use needles to inject street drugs.  You live with, or have sex with, someone who has hepatitis B.  You are a man who has sex with other men (MSM).  You get hemodialysis treatment.  You take certain medicines for conditions such as cancer, organ transplantation, and autoimmune conditions.  Hepatitis C blood testing is recommended for all people born from [birth year] through 1965 and any individual with known risks for hepatitis C.  Practice safe sex. Use condoms and avoid high-risk sexual practices to reduce the spread of sexually transmitted infections (STIs). STIs include gonorrhea, chlamydia, syphilis, trichomonas, herpes, HPV, and human immunodeficiency virus (HIV). Herpes, HIV, and  HPV are viral illnesses that have no cure. They can result in disability, cancer, and death.  If you are at risk of being infected with HIV, it is recommended that you take a prescription medicine daily to prevent HIV infection. This is called preexposure prophylaxis (PrEP). You are considered at risk if:  You are a man who has sex with other men (MSM) and have other risk factors.  You are a heterosexual man, are sexually active, and are at increased risk for HIV infection.  You take drugs by injection.  You are sexually active with a partner who has HIV.  Talk with your health care provider about whether you are at high risk of being infected with HIV. If you choose to begin PrEP, you should first be tested for HIV. You should then be tested every 3 months for as long as you are taking PrEP.  A one-time screening for abdominal aortic aneurysm (AAA) and surgical repair of large AAAs by ultrasound are recommended for men ages 43 to 3 years who are  current or former smokers.  Healthy men should no longer receive prostate-specific antigen (PSA) blood tests as part of routine cancer screening. Talk with your health care provider about prostate cancer screening.  Testicular cancer screening is not recommended for adult males who have no symptoms. Screening includes self-exam, a health care provider exam, and other screening tests. Consult with your health care provider about any symptoms you have or any concerns you have about testicular cancer.  Use sunscreen. Apply sunscreen liberally and repeatedly throughout the day. You should seek shade when your shadow is shorter than you. Protect yourself by wearing long sleeves, pants, a wide-brimmed hat, and sunglasses year round, whenever you are outdoors.  Once a month, do a whole-body skin exam, using a mirror to look at the skin on your back. Tell your health care provider about new moles, moles that have irregular borders, moles that are larger than a pencil eraser, or moles that have changed in shape or color.  Stay current with required vaccines (immunizations).  Influenza vaccine. All adults should be immunized every year.  Tetanus, diphtheria, and acellular pertussis (Td, Tdap) vaccine. An adult who has not previously received Tdap or who does not know his vaccine status should receive 1 dose of Tdap. This initial dose should be followed by tetanus and diphtheria toxoids (Td) booster doses every 10 years. Adults with an unknown or incomplete history of completing a 3-dose immunization series with Td-containing vaccines should begin or complete a primary immunization series including a Tdap dose. Adults should receive a Td booster every 10 years.  Varicella vaccine. An adult without evidence of immunity to varicella should receive 2 doses or a second dose if he has previously received 1 dose.  Human papillomavirus (HPV) vaccine. Males aged 84-21 years who have not received the vaccine  previously should receive the 3-dose series. Males aged 22-26 years may be immunized. Immunization is recommended through the age of 25 years for any male who has sex with males and did not get any or all doses earlier. Immunization is recommended for any person with an immunocompromised condition through the age of 30 years if he did not get any or all doses earlier. During the 3-dose series, the second dose should be obtained 4-8 weeks after the first dose. The third dose should be obtained 24 weeks after the first dose and 16 weeks after the second dose.  Zoster vaccine. One dose is recommended for  adults aged 40 years or older unless certain conditions are present.  Measles, mumps, and rubella (MMR) vaccine. Adults born before [birth year] generally are considered immune to measles and mumps. Adults born in [birth year] or later should have 1 or more doses of MMR vaccine unless there is a contraindication to the vaccine or there is laboratory evidence of immunity to each of the three diseases. A routine second dose of MMR vaccine should be obtained at least 28 days after the first dose for students attending postsecondary schools, health care workers, or international travelers. People who received inactivated measles vaccine or an unknown type of measles vaccine during 1963-1967 should receive 2 doses of MMR vaccine. People who received inactivated mumps vaccine or an unknown type of mumps vaccine before 1979 and are at high risk for mumps infection should consider immunization with 2 doses of MMR vaccine. Unvaccinated health care workers born before [birth year] who lack laboratory evidence of measles, mumps, or rubella immunity or laboratory confirmation of disease should consider measles and mumps immunization with 2 doses of MMR vaccine or rubella immunization with 1 dose of MMR vaccine.  Pneumococcal 13-valent conjugate (PCV13) vaccine. When indicated, a person who is uncertain of his immunization history and has no record  of immunization should receive the PCV13 vaccine. An adult aged 72 years or older who has certain medical conditions and has not been previously immunized should receive 1 dose of PCV13 vaccine. This PCV13 should be followed with a dose of pneumococcal polysaccharide (PPSV23) vaccine. The PPSV23 vaccine dose should be obtained at least 8 weeks after the dose of PCV13 vaccine. An adult aged 80 years or older who has certain medical conditions and previously received 1 or more doses of PPSV23 vaccine should receive 1 dose of PCV13. The PCV13 vaccine dose should be obtained 1 or more years after the last PPSV23 vaccine dose.  Pneumococcal polysaccharide (PPSV23) vaccine. When PCV13 is also indicated, PCV13 should be obtained first. All adults aged 27 years and older should be immunized. An adult younger than age 53 years who has certain medical conditions should be immunized. Any person who resides in a nursing home or long-term care facility should be immunized. An adult smoker should be immunized. People with an immunocompromised condition and certain other conditions should receive both PCV13 and PPSV23 vaccines. People with human immunodeficiency virus (HIV) infection should be immunized as soon as possible after diagnosis. Immunization during chemotherapy or radiation therapy should be avoided. Routine use of PPSV23 vaccine is not recommended for American Indians, [place] Natives, or people younger than 65 years unless there are medical conditions that require PPSV23 vaccine. When indicated, people who have unknown immunization and have no record of immunization should receive PPSV23 vaccine. One-time revaccination 5 years after the first dose of PPSV23 is recommended for people aged 19-64 years who have chronic kidney failure, nephrotic syndrome, asplenia, or immunocompromised conditions. People who received 1-2 doses of PPSV23 before age 33 years should receive another dose of PPSV23 vaccine at age 35 years or  later if at least 5 years have passed since the previous dose. Doses of PPSV23 are not needed for people immunized with PPSV23 at or after age 33 years.  Meningococcal vaccine. Adults with asplenia or persistent complement component deficiencies should receive 2 doses of quadrivalent meningococcal conjugate (MenACWY-D) vaccine. The doses should be obtained at least 2 months apart. Microbiologists working with certain meningococcal bacteria, [employer] recruits, people at risk during an outbreak, and people who travel to or  live in countries with a high rate of meningitis should be immunized. A first-year college student up through age 30 years who is living in a residence hall should receive a dose if he did not receive a dose on or after his 16th birthday. Adults who have certain high-risk conditions should receive one or more doses of vaccine.  Hepatitis A vaccine. Adults who wish to be protected from this disease, have certain high-risk conditions, work with hepatitis A-infected animals, work in hepatitis A research labs, or travel to or work in countries with a high rate of hepatitis A should be immunized. Adults who were previously unvaccinated and who anticipate close contact with an international adoptee during the first 60 days after arrival in the [place] from a country with a high rate of hepatitis A should be immunized.  Hepatitis B vaccine. Adults should be immunized if they wish to be protected from this disease, have certain high-risk conditions, may be exposed to blood or other infectious body fluids, are household contacts or sex partners of hepatitis B positive people, are clients or workers in certain care facilities, or travel to or work in countries with a high rate of hepatitis B.  Haemophilus influenzae type b (Hib) vaccine. A previously unvaccinated person with asplenia or sickle cell disease or having a scheduled splenectomy should receive 1 dose of Hib vaccine. Regardless of  previous immunization, a recipient of a hematopoietic stem cell transplant should receive a 3-dose series 6-12 months after his successful transplant. Hib vaccine is not recommended for adults with HIV infection. Preventive Service / Frequency Ages 42 to 43  Blood pressure check.** / Every 1 to 2 years.  Lipid and cholesterol check.** / Every 5 years beginning at age 6.  Hepatitis C blood test.** / For any individual with known risks for hepatitis C.  Skin self-exam. / Monthly.  Influenza vaccine. / Every year.  Tetanus, diphtheria, and acellular pertussis (Tdap, Td) vaccine.** / Consult your health care provider. 1 dose of Td every 10 years.  Varicella vaccine.** / Consult your health care provider.  HPV vaccine. / 3 doses over 6 months, if 67 or younger.  Measles, mumps, rubella (MMR) vaccine.** / You need at least 1 dose of MMR if you were born in 1957 or later. You may also need a second dose.  Pneumococcal 13-valent conjugate (PCV13) vaccine.** / Consult your health care provider.  Pneumococcal polysaccharide (PPSV23) vaccine.** / 1 to 2 doses if you smoke cigarettes or if you have certain conditions.  Meningococcal vaccine.** / 1 dose if you are age 47 to 57 years and a Market researcher living in a residence hall, or have one of several medical conditions. You may also need additional booster doses.  Hepatitis A vaccine.** / Consult your health care provider.  Hepatitis B vaccine.** / Consult your health care provider.  Haemophilus influenzae type b (Hib) vaccine.** / Consult your health care provider. Ages 54 to 45  Blood pressure check.** / Every 1 to 2 years.  Lipid and cholesterol check.** / Every 5 years beginning at age 73.  Lung cancer screening. / Every year if you are aged 38-80 years and have a 30-pack-year history of smoking and currently smoke or have quit within the past 15 years. Yearly screening is stopped once you have quit smoking for at least  15 years or develop a health problem that would prevent you from having lung cancer treatment.  Fecal occult blood test (FOBT) of stool. / Every  year beginning at age 72 and continuing until age 69. You may not have to do this test if you get a colonoscopy every 10 years.  Flexible sigmoidoscopy** or colonoscopy.** / Every 5 years for a flexible sigmoidoscopy or every 10 years for a colonoscopy beginning at age 71 and continuing until age 32.  Hepatitis C blood test.** / For all people born from [birth year] through 1965 and any individual with known risks for hepatitis C.  Skin self-exam. / Monthly.  Influenza vaccine. / Every year.  Tetanus, diphtheria, and acellular pertussis (Tdap/Td) vaccine.** / Consult your health care provider. 1 dose of Td every 10 years.  Varicella vaccine.** / Consult your health care provider.  Zoster vaccine.** / 1 dose for adults aged 83 years or older.  Measles, mumps, rubella (MMR) vaccine.** / You need at least 1 dose of MMR if you were born in 1955 or later. You may also need a second dose.  Pneumococcal 13-valent conjugate (PCV13) vaccine.** / Consult your health care provider.  Pneumococcal polysaccharide (PPSV23) vaccine.** / 1 to 2 doses if you smoke cigarettes or if you have certain conditions.  Meningococcal vaccine.** / Consult your health care provider.  Hepatitis A vaccine.** / Consult your health care provider.  Hepatitis B vaccine.** / Consult your health care provider.  Haemophilus influenzae type b (Hib) vaccine.** / Consult your health care provider. Ages 30 and over  Blood pressure check.** / Every 1 to 2 years.  Lipid and cholesterol check.**/ Every 5 years beginning at age 50.  Lung cancer screening. / Every year if you are aged 22-80 years and have a 30-pack-year history of smoking and currently smoke or have quit within the past 15 years. Yearly screening is stopped once you have quit smoking for at least 15 years or develop a health  problem that would prevent you from having lung cancer treatment.  Fecal occult blood test (FOBT) of stool. / Every year beginning at age 49 and continuing until age 6. You may not have to do this test if you get a colonoscopy every 10 years.  Flexible sigmoidoscopy** or colonoscopy.** / Every 5 years for a flexible sigmoidoscopy or every 10 years for a colonoscopy beginning at age 18 and continuing until age 33.  Hepatitis C blood test.** / For all people born from [birth year] through 1963 and any individual with known risks for hepatitis C.  Abdominal aortic aneurysm (AAA) screening.** / A one-time screening for ages 85 to 23 years who are current or former smokers.  Skin self-exam. / Monthly.  Influenza vaccine. / Every year.  Tetanus, diphtheria, and acellular pertussis (Tdap/Td) vaccine.** / 1 dose of Td every 10 years.  Varicella vaccine.** / Consult your health care provider.  Zoster vaccine.** / 1 dose for adults aged 84 years or older.  Pneumococcal 13-valent conjugate (PCV13) vaccine.** / Consult your health care provider.  Pneumococcal polysaccharide (PPSV23) vaccine.** / 1 dose for all adults aged 28 years and older.  Meningococcal vaccine.** / Consult your health care provider.  Hepatitis A vaccine.** / Consult your health care provider.  Hepatitis B vaccine.** / Consult your health care provider.  Haemophilus influenzae type b (Hib) vaccine.** / Consult your health care provider. **Family history and personal history of risk and conditions may change your health care provider's recommendations. Document Released: 10/04/2001 Document Revised: 08/13/2013 Document Reviewed: 01/03/2011 [facility] Patient Information 2015 [place], [place]. This information is not intended to replace advice given to you by your health care provider. Make sure  you discuss any questions you have with your health care provider.

## 2014-12-10 NOTE — Assessment & Plan Note (Signed)
EKG without concerning findings. Will obtain lipid panel today.

## 2014-12-16 ENCOUNTER — Encounter: Payer: Self-pay | Admitting: Physician Assistant

## 2014-12-16 ENCOUNTER — Ambulatory Visit (INDEPENDENT_AMBULATORY_CARE_PROVIDER_SITE_OTHER): Payer: BLUE CROSS/BLUE SHIELD | Admitting: Physician Assistant

## 2014-12-16 VITALS — BP 130/82 | HR 78 | Temp 98.7°F | Resp 16 | Ht 73.0 in | Wt 197.0 lb

## 2014-12-16 DIAGNOSIS — Z202 Contact with and (suspected) exposure to infections with a predominantly sexual mode of transmission: Secondary | ICD-10-CM

## 2014-12-16 LAB — POCT URINALYSIS DIPSTICK
Bilirubin, UA: NEGATIVE
Blood, UA: NEGATIVE
Glucose, UA: NEGATIVE
KETONES UA: NEGATIVE
Leukocytes, UA: NEGATIVE
Nitrite, UA: NEGATIVE
Protein, UA: NEGATIVE
SPEC GRAV UA: 1.015
Urobilinogen, UA: NEGATIVE
pH, UA: 6

## 2014-12-16 NOTE — Progress Notes (Signed)
Pre visit review using our clinic review tool, if applicable. No additional management support is needed unless otherwise documented below in the visit note. 

## 2014-12-16 NOTE — Patient Instructions (Addendum)
Please go to the lab for blood work. I will call you with your results. If you develop any symptoms, please let me know.

## 2014-12-16 NOTE — Assessment & Plan Note (Signed)
Asymptomatic. Exam unremarkable.  Will obtain STI panel today via urine and blood testing.  Safe sex practices reviewed.  Will treat if indicated by results.

## 2014-12-16 NOTE — Progress Notes (Signed)
Patient presents to clinic today for STD screening.  Patient asymptomatic but with some concerns his long-term girlfriend has been unfaithful.  Patient endorses remote history of genital warts treated over 10 years ago without recurrence. No other history of STI. Again denies symptoms at present.   Past Medical History  Diagnosis Date  . Hypertension   . Bell's palsy 2003    Current Outpatient Prescriptions on File Prior to Visit  Medication Sig Dispense Refill  . lisinopril (PRINIVIL,ZESTRIL) 10 MG tablet Take 1 tablet (10 mg total) by mouth daily. 90 tablet 1  . meloxicam (MOBIC) 15 MG tablet Take 1 tablet (15 mg total) by mouth daily. 30 tablet 0  . Multiple Vitamin (MULTIVITAMIN) tablet Take 1 tablet by mouth every other day.    . sildenafil (VIAGRA) 100 MG tablet Take 0.5-1 tablets (50-100 mg total) by mouth daily as needed for erectile dysfunction. (Patient taking differently: Take 20 mg by mouth daily as needed for erectile dysfunction. ) 10 tablet 0   No current facility-administered medications on file prior to visit.    No Known Allergies  Family History  Problem Relation Age of Onset  . COPD Mother     Non Smoker (77)   . Hypertension Mother   . Heart disease Father     CABG (3-4 vessel)   . Hypertension Sister   . Alzheimer's disease Paternal Aunt   . Alzheimer's disease Paternal Uncle   . Cancer Sister     Hodgkin's Disease (16); Breast Cancer (Bilateral Mastectomy)   . Hypertension Sister   . Colon cancer Neg Hx   . Esophageal cancer Neg Hx   . Stomach cancer Neg Hx   . Rectal cancer Neg Hx   . Diabetes Neg Hx     History   Social History  . Marital Status: Divorced    Spouse Name: N/A  . Number of Children: N/A  . Years of Education: N/A   Occupational History  . CPA      [employer]   Social History Main Topics  . Smoking status: Never Smoker   . Smokeless tobacco: Never Used  . Alcohol Use: 1.8 oz/week    3 Cans of beer per week     Comment:  Ocasionally   . Drug Use: No  . Sexual Activity: Not Currently   Other Topics Concern  . 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  All other ROS are negative.  BP 130/82 mmHg  Pulse 78  Temp(Src) 98.7 F (37.1 C) (Oral)  Resp 16  Ht 6' 1"  (1.854 m)  Wt 197 lb (89.359 kg)  BMI 26.00 kg/m2  SpO2 100%  Physical Exam  Constitutional: He is well-developed, well-nourished, and in no distress.  HENT:  Head: Normocephalic and atraumatic.  Cardiovascular: Normal rate,  regular rhythm, normal heart sounds and intact distal pulses.   Pulmonary/Chest: Effort normal and breath sounds normal.  Genitourinary: Testes/scrotum normal and penis normal. Penis exhibits no lesions. No discharge found.  Vitals reviewed.   Recent Results (from the past 2160 hour(s))  CBC     Status: Abnormal   Collection Time: 12/10/14  9:10 AM  Result Value Ref Range   WBC 3.9 (L) 4.0 - 10.5 K/uL   RBC 4.79 4.22 - 5.81 Mil/uL   Platelets 178.0 150.0 - 400.0 K/uL   Hemoglobin 14.8 13.0 - 17.0 g/dL   HCT 43.2 39.0 - 52.0 %   MCV 90.2 78.0 - 100.0 fl   MCHC 34.3 30.0 - 36.0 g/dL   RDW 13.3 11.5 - 15.5 %  Comp Met (CMET)     Status: None   Collection Time: 12/10/14  9:10 AM  Result Value Ref Range   Sodium 136 135 - 145 mEq/L   Potassium 4.3 3.5 - 5.1 mEq/L   Chloride 104 96 - 112 mEq/L   CO2 28 19 - 32 mEq/L   Glucose, Bld 85 70 - 99 mg/dL   BUN 22 6 - 23 mg/dL   Creatinine, Ser 1.09 0.40 - 1.50 mg/dL   Total Bilirubin 0.9 0.2 - 1.2 mg/dL   Alkaline Phosphatase 61 39 - 117  U/L   AST 21 0 - 37 U/L   ALT 17 0 - 53 U/L   Total Protein 6.6 6.0 - 8.3 g/dL   Albumin 4.4 3.5 - 5.2 g/dL   Calcium 9.6 8.4 - 10.5 mg/dL   GFR 75.61 >60.00 mL/min  TSH     Status: None   Collection Time: 12/10/14  9:10 AM  Result Value Ref Range   TSH 2.11 0.35 - 4.50 uIU/mL  Hemoglobin A1c     Status: None   Collection Time: 12/10/14  9:10 AM  Result Value Ref Range   Hgb A1c MFr Bld 5.5 4.6 - 6.5 %    Comment: Glycemic Control Guidelines for People with Diabetes:Non Diabetic:  <6%Goal of Therapy: <7%Additional Action Suggested:  >8%   Lipid panel     Status: Abnormal   Collection Time: 12/10/14  9:10 AM  Result Value Ref Range   Cholesterol 204 (H) 0 - 200 mg/dL    Comment: ATP III Classification       Desirable:  < 200 mg/dL               Borderline High:  200 - 239 mg/dL          High:  > = 240 mg/dL   Triglycerides 147.0 0.0 - 149.0 mg/dL    Comment: Normal:  <150 mg/dLBorderline High:  150 - 199 mg/dL   HDL 40.60 >39.00 mg/dL   VLDL 29.4 0.0 - 40.0 mg/dL   LDL Cholesterol 134 (H) 0 - 99 mg/dL   Total CHOL/HDL Ratio 5     Comment:                Men          Women1/2 Average Risk     3.4          3.3Average Risk          5.0          4.42X Average Risk          9.6          7.13X Average Risk  15.0          11.0                       NonHDL 163.40     Comment: NOTE:  Non-HDL goal should be 30 mg/dL higher than patient's LDL goal (i.e. LDL goal of < 70 mg/dL, would have non-HDL goal of < 100 mg/dL)  PSA     Status: None   Collection Time: 12/10/14  9:10 AM  Result Value Ref Range   PSA 0.46 0.10 - 4.00 ng/mL  Urinalysis, Routine w reflex microscopic     Status: Abnormal   Collection Time: 12/10/14  9:10 AM  Result Value Ref Range   Color, Urine YELLOW Yellow;Lt. Yellow   APPearance CLOUDY (A) Clear   Specific Gravity, Urine >=1.030 (A) 1.000 - 1.030   pH 5.5 5.0 - 8.0   Total Protein, Urine NEGATIVE Negative   Urine Glucose NEGATIVE Negative   Ketones, ur  NEGATIVE Negative   Bilirubin Urine NEGATIVE Negative   Hgb urine dipstick NEGATIVE Negative   Urobilinogen, UA 0.2 0.0 - 1.0   Leukocytes, UA NEGATIVE Negative   Nitrite NEGATIVE Negative   WBC, UA none seen 0-2/hpf   RBC / HPF none seen 0-2/hpf   Uric Acid Crys, UA Presence of (A) None  POCT urinalysis dipstick     Status: Normal   Collection Time: 12/16/14  3:22 PM  Result Value Ref Range   Color, UA pale yellow    Clarity, UA clear    Glucose, UA neg    Bilirubin, UA neg    Ketones, UA neg    Spec Grav, UA 1.015    Blood, UA neg    pH, UA 6.0    Protein, UA neg    Urobilinogen, UA negative    Nitrite, UA neg    Leukocytes, UA Negative     Assessment/Plan: Potential exposure to STD Asymptomatic. Exam unremarkable.  Will obtain STI panel today via urine and blood testing.  Safe sex practices reviewed.  Will treat if indicated by results.

## 2014-12-17 ENCOUNTER — Telehealth: Payer: Self-pay

## 2014-12-17 LAB — GC/CHLAMYDIA PROBE AMP, URINE
Chlamydia, Swab/Urine, PCR: NEGATIVE
GC Probe Amp, Urine: NEGATIVE

## 2014-12-17 LAB — HIV ANTIBODY (ROUTINE TESTING W REFLEX): HIV: NONREACTIVE

## 2014-12-17 LAB — RPR

## 2014-12-17 NOTE — Telephone Encounter (Signed)
Pt notified of results and verbalized understanding  

## 2014-12-17 NOTE — Telephone Encounter (Signed)
-----   Message from Brunetta Jeans, PA-C sent at 12/17/2014 10:10 AM EDT ----- HIV, Syphilia, Gonorrhea, Chlamydia all negative.  HSV testing will take a few days.

## 2014-12-19 ENCOUNTER — Telehealth: Payer: Self-pay | Admitting: *Deleted

## 2014-12-19 LAB — HSV(HERPES SMPLX)ABS-I+II(IGG+IGM)-BLD
HSV 1 Glycoprotein G Ab, IgG: 14.7 IV — ABNORMAL HIGH
HSV 2 Glycoprotein G Ab, IgG: <0.1 IV
Herpes Simplex Vrs I&II-IgM Ab (EIA): 0.31 INDEX

## 2014-12-19 NOTE — Telephone Encounter (Signed)
Called and lm for pt to please return call. This is concerning recent lab results. JG//CMA

## 2015-01-07 ENCOUNTER — Ambulatory Visit (INDEPENDENT_AMBULATORY_CARE_PROVIDER_SITE_OTHER): Payer: BLUE CROSS/BLUE SHIELD | Admitting: Physician Assistant

## 2015-01-07 ENCOUNTER — Encounter: Payer: Self-pay | Admitting: Physician Assistant

## 2015-01-07 VITALS — BP 143/83 | HR 56 | Temp 98.2°F | Ht 73.0 in | Wt 197.4 lb

## 2015-01-07 DIAGNOSIS — B9689 Other specified bacterial agents as the cause of diseases classified elsewhere: Secondary | ICD-10-CM

## 2015-01-07 DIAGNOSIS — J019 Acute sinusitis, unspecified: Secondary | ICD-10-CM

## 2015-01-07 MED ORDER — AMOXICILLIN-POT CLAVULANATE 875-125 MG PO TABS: 1.0000 | ORAL_TABLET | Freq: Two times a day (BID) | ORAL | Status: DC

## 2015-01-07 NOTE — Assessment & Plan Note (Signed)
Rx Augmentin.  Increase fluids.  Rest.  Saline nasal spray.  Probiotic.  Mucinex as directed.  Humidifier in bedroom. Zyrtec daily.  Call or return to clinic if symptoms are not improving.

## 2015-01-07 NOTE — Progress Notes (Signed)
Pre visit review using our clinic review tool, if applicable. No additional management support is needed unless otherwise documented below in the visit note. 

## 2015-01-07 NOTE — Progress Notes (Signed)
Patient presents to clinic today c/o 1 week of chest congestion, nasal congestion and worsening productive cough associated with some sinus pressure/pain and headache. Endorses significant PND.  Denies fever, chills, chest pain or SOB.  Denies recent travel or sick contact.  Past Medical History  Diagnosis Date  . Hypertension   . Bell's palsy 2003    Current Outpatient Prescriptions on File Prior to Visit  Medication Sig Dispense Refill  . lisinopril (PRINIVIL,ZESTRIL) 10 MG tablet Take 1 tablet (10 mg total) by mouth daily. 90 tablet 1  . Multiple Vitamin (MULTIVITAMIN) tablet Take 1 tablet by mouth every other day.    . sildenafil (VIAGRA) 100 MG tablet Take 0.5-1 tablets (50-100 mg total) by mouth daily as needed for erectile dysfunction. (Patient taking differently: Take 20 mg by mouth daily as needed for erectile dysfunction. ) 10 tablet 0   No current facility-administered medications on file prior to visit.    No Known Allergies  Family History  Problem Relation Age of Onset  . COPD Mother     Non Smoker (41)   . Hypertension Mother   . Heart disease Father     CABG (3-4 vessel)   . Hypertension Sister   . Alzheimer's disease Paternal Aunt   . Alzheimer's disease Paternal Uncle   . Cancer Sister     Hodgkin's Disease (16); Breast Cancer (Bilateral Mastectomy)   . Hypertension Sister   . Colon cancer Neg Hx   . Esophageal cancer Neg Hx   . Stomach cancer Neg Hx   . Rectal cancer Neg Hx   . Diabetes Neg Hx     History   Social History  . Marital Status: Divorced    Spouse Name: N/A  . Number of Children: N/A  . Years of Education: N/A   Occupational History  . CPA      [employer]   Social History Main Topics  . Smoking status: Never Smoker   . Smokeless tobacco: Never Used  . Alcohol Use: 1.8 oz/week    3 Cans of beer per week     Comment: Ocasionally   . Drug Use: No  . Sexual Activity: Not Currently   Other Topics Concern  . 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  All other ROS are negative.  BP 143/83 mmHg  Pulse 56  Temp(Src) 98.2 F (36.8 C) (Oral)  Ht 6' 1"  (1.854 m)  Wt 197 lb 6.4 oz (89.54 kg)  BMI 26.05 kg/m2  SpO2 100%  Physical Exam  Constitutional: He is oriented to person, place, and time and well-developed, well-nourished, and in no distress.  HENT:  Head: Normocephalic and atraumatic.  Eyes: Conjunctivae are normal.  Neck: Neck supple.  Cardiovascular: Normal rate, regular rhythm, normal heart sounds and intact distal pulses.  Pulmonary/Chest: Effort normal and breath sounds normal. No respiratory distress. He has no wheezes. He has no rales. He exhibits no tenderness.  Lymphadenopathy:    He has no cervical adenopathy.  Neurological: He is alert and oriented to person, place, and time.  Skin: Skin is warm and dry. No rash noted.  Psychiatric: Affect normal.  Vitals reviewed.   Recent Results (from the past 2160 hour(s))  CBC     Status: Abnormal   Collection Time: 12/10/14  9:10 AM  Result Value Ref Range   WBC 3.9 (L) 4.0 - 10.5 K/uL   RBC 4.79 4.22 - 5.81 Mil/uL   Platelets 178.0 150.0 - 400.0 K/uL   Hemoglobin 14.8 13.0 - 17.0 g/dL   HCT 43.2 39.0 - 52.0 %   MCV 90.2 78.0 - 100.0 fl   MCHC 34.3 30.0 - 36.0 g/dL   RDW 13.3 11.5 - 15.5 %  Comp Met (CMET)     Status: None   Collection Time: 12/10/14  9:10 AM  Result Value Ref Range   Sodium 136 135 - 145 mEq/L   Potassium 4.3 3.5 - 5.1 mEq/L   Chloride 104 96 - 112 mEq/L   CO2 28 19 - 32 mEq/L   Glucose, Bld 85 70 -  99 mg/dL   BUN 22 6 - 23 mg/dL   Creatinine, Ser 1.09 0.40 - 1.50 mg/dL   Total Bilirubin 0.9 0.2 - 1.2 mg/dL   Alkaline Phosphatase 61 39 - 117 U/L   AST 21 0 - 37 U/L   ALT 17 0 - 53 U/L   Total Protein 6.6 6.0 - 8.3 g/dL   Albumin 4.4 3.5 - 5.2 g/dL   Calcium 9.6 8.4 - 10.5 mg/dL   GFR 75.61 >60.00 mL/min  TSH     Status: None   Collection Time: 12/10/14  9:10 AM  Result Value Ref Range   TSH 2.11 0.35 - 4.50 uIU/mL  Hemoglobin A1c     Status: None   Collection Time: 12/10/14  9:10 AM  Result Value Ref Range   Hgb A1c MFr Bld 5.5 4.6 - 6.5 %    Comment: Glycemic Control Guidelines for People with Diabetes:Non Diabetic:  <6%Goal of Therapy: <7%Additional Action Suggested:  >8%   Lipid panel     Status: Abnormal   Collection Time: 12/10/14  9:10 AM  Result Value Ref Range   Cholesterol 204 (H) 0 - 200 mg/dL    Comment: ATP III Classification       Desirable:  < 200 mg/dL               Borderline High:  200 - 239 mg/dL          High:  > = 240 mg/dL   Triglycerides 147.0 0.0 - 149.0 mg/dL    Comment: Normal:  <150 mg/dLBorderline High:  150 - 199 mg/dL   HDL 40.60 >39.00 mg/dL   VLDL 29.4 0.0 - 40.0 mg/dL   LDL Cholesterol 134 (H) 0 - 99 mg/dL   Total CHOL/HDL Ratio 5     Comment:                Men          Women1/2 Average Risk     3.4          3.3Average Risk          5.0          4.42X Average Risk  9.6          7.13X Average Risk          15.0          11.0                       NonHDL 163.40     Comment: NOTE:  Non-HDL goal should be 30 mg/dL higher than patient's LDL goal (i.e. LDL goal of < 70 mg/dL, would have non-HDL goal of < 100 mg/dL)  PSA     Status: None   Collection Time: 12/10/14  9:10 AM  Result Value Ref Range   PSA 0.46 0.10 - 4.00 ng/mL  Urinalysis, Routine w reflex microscopic     Status: Abnormal   Collection Time: 12/10/14  9:10 AM  Result Value Ref Range   Color, Urine YELLOW Yellow;Lt. Yellow   APPearance CLOUDY (A) Clear   Specific Gravity,  Urine >=1.030 (A) 1.000 - 1.030   pH 5.5 5.0 - 8.0   Total Protein, Urine NEGATIVE Negative   Urine Glucose NEGATIVE Negative   Ketones, ur NEGATIVE Negative   Bilirubin Urine NEGATIVE Negative   Hgb urine dipstick NEGATIVE Negative   Urobilinogen, UA 0.2 0.0 - 1.0   Leukocytes, UA NEGATIVE Negative   Nitrite NEGATIVE Negative   WBC, UA none seen 0-2/hpf   RBC / HPF none seen 0-2/hpf   Uric Acid Crys, UA Presence of (A) None  POCT urinalysis dipstick     Status: Normal   Collection Time: 12/16/14  3:22 PM  Result Value Ref Range   Color, UA pale yellow    Clarity, UA clear    Glucose, UA neg    Bilirubin, UA neg    Ketones, UA neg    Spec Grav, UA 1.015    Blood, UA neg    pH, UA 6.0    Protein, UA neg    Urobilinogen, UA negative    Nitrite, UA neg    Leukocytes, UA Negative   HIV antibody (with reflex)     Status: None   Collection Time: 12/16/14  3:28 PM  Result Value Ref Range   HIV 1&2 Ab, 4th Generation NONREACTIVE NONREACTIVE    Comment:   A NONREACTIVE HIV Ag/Ab result does not exclude HIV infection since the time frame for seroconversion is variable. If acute HIV infection is suspected, a HIV-1 RNA Qualitative TMA test is recommended.   HIV-1/2 Antibody Diff         Not indicated. HIV-1 RNA, Qual TMA           Not indicated.   PLEASE NOTE: This information has been disclosed to you from records whose confidentiality may be protected by state law. If your state requires such protection, then the state law prohibits you from making any further disclosure of the information without the specific written consent of the person to whom it pertains, or as otherwise permitted by law. A general authorization for the release of medical or other information is NOT sufficient for this purpose.   The performance of this assay has not been clinically validated in patients less than 33 years old.   RPR     Status: None   Collection Time: 12/16/14  3:28 PM  Result Value  Ref Range   RPR Ser Ql NON REAC NON REAC  GC/chlamydia probe amp, urine     Status: None   Collection Time: 12/16/14  3:28 PM  Result Value Ref  Range   Chlamydia, Swab/Urine, PCR NEGATIVE NEGATIVE    Comment:                      **Normal Reference Range: Negative**          Assay performed using the Gen-Probe APTIMA COMBO2 (R) Assay.      GC Probe Amp, Urine NEGATIVE NEGATIVE    Comment:                      **Normal Reference Range: Negative**          Assay performed using the Gen-Probe APTIMA COMBO2 (R) Assay.     HSV(herpes smplx)abs-1+2(IgG+IgM)-bld     Status: Abnormal   Collection Time: 12/16/14  3:28 PM  Result Value Ref Range   HSV 1 Glycoprotein G Ab, IgG 14.70 (H) IV    Comment:      IV = Index Value              < 0.90 IV              Negative              0.90-1.10 IV           Equivocal              > 1.10 IV              Positive    HSV 2 Glycoprotein G Ab, IgG <0.10 IV    Comment:      IV = Index Value              < 0.90 IV              Negative              0.90-1.10 IV           Equivocal              > 1.10 IV              Positive    Herpes Simplex Vrs I&II-IgM Ab (EIA) 0.31 INDEX    Comment:                 <=0.90 . . . . . . [name] NEGATIVE               0.91-1.09. . . . . [name] EQUIVOCAL               >=1.10 . . . . . . [name] POSITIVE                                                                                                   The results obtained with the HSV 1 & 2 IgM test should serve only as an aid to diagnosis and should not be interpreted as diagnostic by themselves. Heterotypic IgM antibody responses may occur in patients with a history of infection with other Herpes viruses, including Epstein-Barr virus and Varicella zoster virus, and give false positive results in HSV 1 &  2 IgM tests.  This test does not distinguish between HSV 1 and HSV 2.  A positive HSV IgM may indicate primary infection, but IgM antibody can persist 12 or more months  after primary infection.  For confirmation, if clinically indicated, positive IgM results could be followed with the test for HSV 1 & 2 IgG glycoprotein (test code [id number]) in 4-6 weeks.     Assessment/Plan: Acute bacterial sinusitis Rx Augmentin.  Increase fluids.  Rest.  Saline nasal spray.  Probiotic.  Mucinex as directed.  Humidifier in bedroom. Zyrtec daily.  Call or return to clinic if symptoms are not improving.

## 2015-01-07 NOTE — Patient Instructions (Signed)
Please take antibiotic as directed.  Increase fluid intake.  Use Saline nasal spray.  Take a daily multivitamin. Continue Mucinex. Place a humidifier in the bedroom.  Please call or return clinic if symptoms are not improving.  Sinusitis Sinusitis is redness, soreness, and swelling (inflammation) of the paranasal sinuses. Paranasal sinuses are air pockets within the bones of your face (beneath the eyes, the middle of the forehead, or above the eyes). In healthy paranasal sinuses, mucus is able to drain out, and air is able to circulate through them by way of your nose. However, when your paranasal sinuses are inflamed, mucus and air can become trapped. This can allow bacteria and other germs to grow and cause infection. Sinusitis can develop quickly and last only a short time (acute) or continue over a long period (chronic). Sinusitis that lasts for more than 12 weeks is considered chronic.  CAUSES  Causes of sinusitis include:  Allergies.  Structural abnormalities, such as displacement of the cartilage that separates your nostrils (deviated septum), which can decrease the air flow through your nose and sinuses and affect sinus drainage.  Functional abnormalities, such as when the small hairs (cilia) that line your sinuses and help remove mucus do not work properly or are not present. SYMPTOMS  Symptoms of acute and chronic sinusitis are the same. The primary symptoms are pain and pressure around the affected sinuses. Other symptoms include:  Upper toothache.  Earache.  Headache.  Bad breath.  Decreased sense of smell and taste.  A cough, which worsens when you are lying flat.  Fatigue.  Fever.  Thick drainage from your nose, which often is green and may contain pus (purulent).  Swelling and warmth over the affected sinuses. DIAGNOSIS  Your caregiver will perform a physical exam. During the exam, your caregiver may:  Look in your nose for signs of abnormal growths in your  nostrils (nasal polyps).  Tap over the affected sinus to check for signs of infection.  View the inside of your sinuses (endoscopy) with a special imaging device with a light attached (endoscope), which is inserted into your sinuses. If your caregiver suspects that you have chronic sinusitis, one or more of the following tests may be recommended:  Allergy tests.  Nasal culture A sample of mucus is taken from your nose and sent to a lab and screened for bacteria.  Nasal cytology A sample of mucus is taken from your nose and examined by your caregiver to determine if your sinusitis is related to an allergy. TREATMENT  Most cases of acute sinusitis are related to a viral infection and will resolve on their own within 10 days. Sometimes medicines are prescribed to help relieve symptoms (pain medicine, decongestants, nasal steroid sprays, or saline sprays).  However, for sinusitis related to a bacterial infection, your caregiver will prescribe antibiotic medicines. These are medicines that will help kill the bacteria causing the infection.  Rarely, sinusitis is caused by a fungal infection. In theses cases, your caregiver will prescribe antifungal medicine. For some cases of chronic sinusitis, surgery is needed. Generally, these are cases in which sinusitis recurs more than 3 times per year, despite other treatments. HOME CARE INSTRUCTIONS   Drink plenty of water. Water helps thin the mucus so your sinuses can drain more easily.  Use a humidifier.  Inhale steam 3 to 4 times a day (for example, sit in the bathroom with the shower running).  Apply a warm, moist washcloth to your face 3 to 4 times a   day, or as directed by your caregiver.  Use saline nasal sprays to help moisten and clean your sinuses.  Take over-the-counter or prescription medicines for pain, discomfort, or fever only as directed by your caregiver. SEEK IMMEDIATE MEDICAL CARE IF:  You have increasing pain or severe  headaches.  You have nausea, vomiting, or drowsiness.  You have swelling around your face.  You have vision problems.  You have a stiff neck.  You have difficulty breathing. MAKE SURE YOU:   Understand these instructions.  Will watch your condition.  Will get help right away if you are not doing well or get worse. Document Released: 08/08/2005 Document Revised: 10/31/2011 Document Reviewed: 08/23/2011 [employer] Patient Information 2014 [employer].   

## 2015-02-20 ENCOUNTER — Telehealth: Payer: Self-pay | Admitting: Physician Assistant

## 2015-02-20 MED ORDER — SILDENAFIL CITRATE 100 MG PO TABS: 50.0000 mg | ORAL_TABLET | Freq: Every day | ORAL | Status: DC | PRN

## 2015-02-20 NOTE — Telephone Encounter (Signed)
Requesting Viagra 100mg -Take 0.5-1 tablet by mouth daily as needed for erectile dysfunction. Last refill:10/29/14;#10,0 Last OV:01/07/15 Please advise.//AB/CMA

## 2015-02-20 NOTE — Telephone Encounter (Signed)
REfill sent. 

## 2015-02-20 NOTE — Telephone Encounter (Signed)
Relation to pt: self  Call back number: [phone] Pharmacy: [facility], [place] [phone] (Phone) [phone] (Fax)          Reason for call:  Pt requesting a refill sildenafil (VIAGRA) 100 MG tablet

## 2015-04-17 ENCOUNTER — Other Ambulatory Visit (INDEPENDENT_AMBULATORY_CARE_PROVIDER_SITE_OTHER): Payer: BLUE CROSS/BLUE SHIELD

## 2015-04-17 ENCOUNTER — Telehealth: Payer: Self-pay | Admitting: Physician Assistant

## 2015-04-17 DIAGNOSIS — Z113 Encounter for screening for infections with a predominantly sexual mode of transmission: Secondary | ICD-10-CM

## 2015-04-17 LAB — RPR

## 2015-04-17 NOTE — Telephone Encounter (Signed)
Relation to pt: self  Call back number: [phone]   Reason for call:  Patient would like a repeat of all labs taken on 12/16/14. Patient would like lab work done today. Please advise

## 2015-04-17 NOTE — Telephone Encounter (Signed)
LMOM with contact name and number for return call RE: informed that labs are placed by provider and to schedule lab appt/SLS

## 2015-04-17 NOTE — Telephone Encounter (Signed)
Orders placed. Schedule appointment for lab today. If any symptoms, recommend he been seen.

## 2015-04-17 NOTE — Telephone Encounter (Signed)
STD Panel, Please Advise/SLS

## 2015-04-18 LAB — HIV ANTIBODY (ROUTINE TESTING W REFLEX): HIV 1&2 Ab, 4th Generation: NONREACTIVE

## 2015-04-21 LAB — HSV(HERPES SMPLX)ABS-I+II(IGG+IGM)-BLD
HERPES SIMPLEX VRS I-IGM AB (EIA): 0.22 {index}
HSV 1 Glycoprotein G Ab, IgG: 6.88 IV — ABNORMAL HIGH
HSV 2 Glycoprotein G Ab, IgG: <0.1 IV

## 2015-06-15 ENCOUNTER — Other Ambulatory Visit: Payer: Self-pay | Admitting: Physician Assistant

## 2015-06-17 ENCOUNTER — Ambulatory Visit (INDEPENDENT_AMBULATORY_CARE_PROVIDER_SITE_OTHER): Payer: BLUE CROSS/BLUE SHIELD | Admitting: Physician Assistant

## 2015-06-17 ENCOUNTER — Encounter: Payer: Self-pay | Admitting: Physician Assistant

## 2015-06-17 VITALS — BP 130/80 | HR 58 | Temp 97.6°F | Resp 16 | Ht 73.0 in | Wt 198.1 lb

## 2015-06-17 DIAGNOSIS — F411 Generalized anxiety disorder: Secondary | ICD-10-CM | POA: Diagnosis not present

## 2015-06-17 DIAGNOSIS — I10 Essential (primary) hypertension: Secondary | ICD-10-CM

## 2015-06-17 DIAGNOSIS — Z113 Encounter for screening for infections with a predominantly sexual mode of transmission: Secondary | ICD-10-CM

## 2015-06-17 MED ORDER — SILDENAFIL CITRATE 100 MG PO TABS: 50.0000 mg | ORAL_TABLET | Freq: Every day | ORAL | Status: DC | PRN

## 2015-06-17 NOTE — Progress Notes (Signed)
Pre visit review using our clinic review tool, if applicable. No additional management support is needed unless otherwise documented below in the visit note/SLS  

## 2015-06-17 NOTE — Assessment & Plan Note (Signed)
Due to recent stressors. Patient refuses medication. Handout on our counseling services given. Patient to follow-up if symptoms are not improving.

## 2015-06-17 NOTE — Assessment & Plan Note (Signed)
Stable.  Medications refilled. 

## 2015-06-17 NOTE — Assessment & Plan Note (Signed)
Will repeat HSV testing. Reassurance given that prior testing revealed HSV I virus but never HSV II.

## 2015-06-17 NOTE — Progress Notes (Signed)
Patient presents to clinic today to discuss medication management. Was laid off from work last Thursday. Is needing refills of medication before insurance runs out. Is having increased anxiety due to being laid off. Is trying to exercise as a stress outlet but is only helping some. Endorses generalized anxiety but feels the other day he had a panic attack. Denies depressed mood or SI. Does not wish to start medication for this.  Is requesting repeat testing for herpes. No symptoms and two prior negative HSV 2 tests. States he is just anxious because his last partner was positive and he is scared he contracted the virus.  Past Medical History  Diagnosis Date  . Hypertension   . Bell's palsy 2003    Current Outpatient Prescriptions on File Prior to Visit  Medication Sig Dispense Refill  . lisinopril (PRINIVIL,ZESTRIL) 10 MG tablet TAKE 1 TABLET (10 MG TOTAL) BY MOUTH DAILY. 90 tablet 1  . Multiple Vitamin (MULTIVITAMIN) tablet Take 1 tablet by mouth every other day.     No current facility-administered medications on file prior to visit.    No Known Allergies  Family History  Problem Relation Age of Onset  . COPD Mother     Non Smoker (83)   . Hypertension Mother   . Heart disease Father     CABG (3-4 vessel)   . Hypertension Sister   . Alzheimer's disease Paternal Aunt   . Alzheimer's disease Paternal Uncle   . Cancer Sister     Hodgkin's Disease (16); Breast Cancer (Bilateral Mastectomy)   . Hypertension Sister   . Colon cancer Neg Hx   . Esophageal cancer Neg Hx   . Stomach cancer Neg Hx   . Rectal cancer Neg Hx   . Diabetes Neg Hx     Social History   Social History  . Marital Status: Divorced    Spouse Name: N/A  . Number of Children: N/A  . Years of Education: N/A   Occupational History  . CPA      [employer]   Social History Main Topics  . Smoking status: Never Smoker   . Smokeless tobacco: Never Used  . Alcohol Use: 1.8 oz/week    3 Cans of beer per  week     Comment: Ocasionally   . Drug Use: No  . Sexual Activity: Not Currently   Other Topics Concern  . 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  All other ROS are negative.  BP 130/80 mmHg  Pulse 58  Temp(Src) 97.6 F (36.4 C) (Oral)  Resp 16  Ht 6\' 1"  (1.854 m)  Wt 198 lb 2 oz (89.869 kg)  BMI 26.15 kg/m2  SpO2 97%  Physical Exam  Constitutional: He is oriented to person, place, and time and well-developed, well-nourished, and in no  distress.  HENT:  Head: Normocephalic and atraumatic.  Eyes: Conjunctivae are normal.  Cardiovascular: Normal rate, regular rhythm, normal heart sounds and intact distal pulses.   Pulmonary/Chest: Effort normal and breath sounds normal. No respiratory distress. He has no wheezes. He has no rales. He exhibits no tenderness.  Neurological: He is alert and oriented to person, place, and time.  Skin: Skin is warm and dry. No rash noted.  Psychiatric: His mood appears anxious.  Vitals reviewed.   Recent Results (from the past 2160 hour(s))  HIV antibody (with reflex)     Status: None   Collection Time: 04/17/15  3:59 PM  Result Value Ref Range   HIV 1&2 Ab, 4th Generation NONREACTIVE NONREACTIVE    Comment:   HIV-1 antigen and HIV-1/HIV-2 antibodies were not detected.  There is no laboratory evidence of HIV infection.   HIV-1/2 Antibody Diff        Not indicated. HIV-1 RNA, Qual TMA          Not indicated.     PLEASE NOTE: This information has been disclosed to you from records whose confidentiality may be  protected by state law. If your state requires such protection, then the state law prohibits you from making any further disclosure of the information without the specific written consent of the person to whom it pertains, or as otherwise permitted by law. A general authorization for the release of medical or other information is NOT sufficient for this purpose.   The performance of this assay has not been clinically validated in patients less than 52 years old.   For additional information please refer to http://education.quest[employer].com/faq/FAQ106.  (This link is being provided for informational/educational purposes only.)     RPR     Status: None   Collection Time: 04/17/15  3:59 PM  Result Value Ref Range   RPR Ser Ql NON REAC NON REAC  HSV(herpes smplx)abs-1+2(IgG+IgM)-bld     Status: Abnormal   Collection Time: 04/17/15  3:59 PM  Result Value Ref Range   HSV 1 Glycoprotein G Ab, IgG 6.88 (H) IV    Comment:      IV = Index Value              < 0.90 IV              Negative              0.90-1.10 IV           Equivocal              > 1.10 IV              Positive    HSV 2 Glycoprotein G Ab, IgG <0.10 IV    Comment:      IV = Index Value              < 0.90 IV              Negative              0.90-1.10 IV           Equivocal              > 1.10 IV              Positive    Herpes Simplex Vrs I&II-IgM Ab (EIA) 0.22 INDEX    Comment:                 <=0.90 . . . . . . [name]  NEGATIVE               0.91-1.09. . . . . [name] EQUIVOCAL               >=1.10 . . . . . . [name] POSITIVE                                                                                                   The results obtained with the HSV 1 & 2 IgM test should serve only as an aid to diagnosis and should not be interpreted as diagnostic by themselves. Heterotypic IgM antibody responses may occur in patients with a history of infection with other Herpes viruses, including Epstein-Barr virus and Varicella  zoster virus, and give false positive results in HSV 1 & 2 IgM tests.  This test does not distinguish between HSV 1 and HSV 2.  A positive HSV IgM may indicate primary infection, but IgM antibody can persist 12 or more months after primary infection.  For confirmation, if clinically indicated, positive IgM results could be followed with the test for HSV 1 & 2 IgG glycoprotein (test code [id number]) in 4-6 weeks.    Assessment/Plan: Screen for STD (sexually transmitted disease) Will repeat HSV testing. Reassurance given that prior testing revealed HSV I virus but never HSV II.  Essential hypertension, benign Stable. Medications refilled.  Anxiety state Due to recent stressors. Patient refuses medication. Handout on our counseling services given. Patient to follow-up if symptoms are not improving.

## 2015-06-17 NOTE — Patient Instructions (Signed)
Please continue medications as directed.  A refill of your BP medication has been sent in. Stop by the lab. I will call you with your results.  If anxiety is not improving, I encourage you to reconsider medication. Please stay active as this is a great outlet.

## 2015-06-19 LAB — HSV(HERPES SMPLX)ABS-I+II(IGG+IGM)-BLD
HERPES SIMPLEX VRS I-IGM AB (EIA): 0.27 {index}
HSV 1 GLYCOPROTEIN G AB, IGG: 7.04 IV — AB

## 2015-11-23 ENCOUNTER — Encounter: Payer: Self-pay | Admitting: Physician Assistant

## 2015-11-23 ENCOUNTER — Ambulatory Visit (INDEPENDENT_AMBULATORY_CARE_PROVIDER_SITE_OTHER): Payer: BLUE CROSS/BLUE SHIELD | Admitting: Physician Assistant

## 2015-11-23 VITALS — BP 110/68 | HR 55 | Temp 97.8°F | Ht 73.0 in | Wt 198.8 lb

## 2015-11-23 DIAGNOSIS — Z7251 High risk heterosexual behavior: Secondary | ICD-10-CM | POA: Diagnosis not present

## 2015-11-23 LAB — ACUTE HEP PANEL AND HEP B SURFACE AB
HCV AB: NEGATIVE
HEP B C IGM: NONREACTIVE
Hep A IgM: NONREACTIVE
Hep B S Ab: POSITIVE — AB
Hepatitis B Surface Ag: NEGATIVE

## 2015-11-23 LAB — HIV ANTIBODY (ROUTINE TESTING W REFLEX): HIV: NONREACTIVE

## 2015-11-23 NOTE — Progress Notes (Signed)
Pre visit review using our clinic review tool, if applicable. No additional management support is needed unless otherwise documented below in the visit note. 

## 2015-11-23 NOTE — Patient Instructions (Signed)
Your exam today looks good. Please go to the lab for blood work. I will call you with your results.  Always follow safe sex practices including limiting sexual partners, wearing condoms consistently.  Sexually Transmitted Disease A sexually transmitted disease (STD) is a disease or infection that may be passed (transmitted) from person to person, usually during sexual activity. This may happen by way of saliva, semen, blood, vaginal mucus, or urine. Common STDs include:  Gonorrhea.  Chlamydia.  Syphilis.  HIV and AIDS.  Genital herpes.  Hepatitis B and C.  Trichomonas.  Human papillomavirus (HPV).  Pubic lice.  Scabies.  Mites.  Bacterial vaginosis. WHAT ARE CAUSES OF STDs? An STD may be caused by bacteria, a virus, or parasites. STDs are often transmitted during sexual activity if one person is infected. However, they may also be transmitted through nonsexual means. STDs may be transmitted after:   Sexual intercourse with an infected person.  Sharing sex toys with an infected person.  Sharing needles with an infected person or using unclean piercing or tattoo needles.  Having intimate contact with the genitals, mouth, or rectal areas of an infected person.  Exposure to infected fluids during birth. WHAT ARE THE SIGNS AND SYMPTOMS OF STDs? Different STDs have different symptoms. Some people may not have any symptoms. If symptoms are present, they may include:  Painful or bloody urination.  Pain in the pelvis, abdomen, vagina, anus, throat, or eyes.  A skin rash, itching, or irritation.  Growths, ulcerations, blisters, or sores in the genital and anal areas.  Abnormal vaginal discharge with or without bad odor.  Penile discharge in men.  Fever.  Pain or bleeding during sexual intercourse.  Swollen glands in the groin area.  Yellow skin and eyes (jaundice). This is seen with hepatitis.  Swollen testicles.  Infertility.  Sores and blisters in the  mouth. HOW ARE STDs DIAGNOSED? To make a diagnosis, your health care provider may:  Take a medical history.  Perform a physical exam.  Take a sample of any discharge to examine.  Swab the throat, cervix, opening to the penis, rectum, or vagina for testing.  Test a sample of your first morning urine.  Perform blood tests.  Perform a Pap test, if this applies.  Perform a colposcopy.  Perform a laparoscopy. HOW ARE STDs TREATED? Treatment depends on the STD. Some STDs may be treated but not cured.  Chlamydia, gonorrhea, trichomonas, and syphilis can be cured with antibiotic medicine.  Genital herpes, hepatitis, and HIV can be treated, but not cured, with prescribed medicines. The medicines lessen symptoms.  Genital warts from HPV can be treated with medicine or by freezing, burning (electrocautery), or surgery. Warts may come back.  HPV cannot be cured with medicine or surgery. However, abnormal areas may be removed from the cervix, vagina, or vulva.  If your diagnosis is confirmed, your recent sexual partners need treatment. This is true even if they are symptom-free or have a negative culture or evaluation. They should not have sex until their health care providers say it is okay.  Your health care provider may test you for infection again 3 months after treatment. HOW CAN I REDUCE MY RISK OF GETTING AN STD? Take these steps to reduce your risk of getting an STD:  Use latex condoms, dental dams, and water-soluble lubricants during sexual activity. Do not use petroleum jelly or oils.  Avoid having multiple sex partners.  Do not have sex with someone who has other sex partners  Do not have sex with anyone you do not know or who is at high risk for an STD.  Avoid risky sex practices that can break your skin.  Do not have sex if you have open sores on your mouth or skin.  Avoid drinking too much alcohol or taking illegal drugs. Alcohol and drugs can affect your judgment  and put you in a vulnerable position.  Avoid engaging in oral and anal sex acts.  Get vaccinated for HPV and hepatitis. If you have not received these vaccines in the past, talk to your health care provider about whether one or both might be right for you.  If you are at risk of being infected with HIV, it is recommended that you take a prescription medicine daily to prevent HIV infection. This is called pre-exposure prophylaxis (PrEP). You are considered at risk if:  You are a man who has sex with other men (MSM).  You are a heterosexual man or woman and are sexually active with more than one partner.  You take drugs by injection.  You are sexually active with a partner who has HIV.  Talk with your health care provider about whether you are at high risk of being infected with HIV. If you choose to begin PrEP, you should first be tested for HIV. You should then be tested every 3 months for as long as you are taking PrEP. WHAT SHOULD I DO IF I THINK I HAVE AN STD?  See your health care provider.  Tell your sexual partner(s). They should be tested and treated for any STDs.  Do not have sex until your health care provider says it is okay. WHEN SHOULD I GET IMMEDIATE MEDICAL CARE? Contact your health care provider right away if:   You have severe abdominal pain.  You are a man and notice swelling or pain in your testicles.  You are a woman and notice swelling or pain in your vagina.   This information is not intended to replace advice given to you by your health care provider. Make sure you discuss any questions you have with your health care provider.   Document Released: 10/29/2002 Document Revised: 08/29/2014 Document Reviewed: 02/26/2013 Elsevier Interactive Patient Education [employer].

## 2015-11-23 NOTE — Assessment & Plan Note (Signed)
Exam unremarkable. Will obtain STI panel today. Patient has had repeat visits for STD screen. Discussed risks of high-risk sexual behavior and consistent condom use. Discussed with patient that some STD can still be transmitted via skin even with condom use so limiting sexual partners is important.

## 2015-11-23 NOTE — Progress Notes (Signed)
Patient presents to clinic today requesting routine screening for STD. Patient endorses a new male sexual partner, endorsing an episode of unprotected sex 2 weeks ago. Patient denies any penile pain, testicular pain, discharge, dysuria or lesions. Patient denies partner having any symptoms.  Past Medical History  Diagnosis Date  . Hypertension   . Bell's palsy 2003    Current Outpatient Prescriptions on File Prior to Visit  Medication Sig Dispense Refill  . lisinopril (PRINIVIL,ZESTRIL) 10 MG tablet TAKE 1 TABLET (10 MG TOTAL) BY MOUTH DAILY. 90 tablet 1  . Multiple Vitamin (MULTIVITAMIN) tablet Take 1 tablet by mouth every other day.    . sildenafil (VIAGRA) 100 MG tablet Take 0.5-1 tablets (50-100 mg total) by mouth daily as needed for erectile dysfunction. 10 tablet 0   No current facility-administered medications on file prior to visit.    No Known Allergies  Family History  Problem Relation Age of Onset  . COPD Mother     Non Smoker (29)   . Hypertension Mother   . Heart disease Father     CABG (3-4 vessel)   . Hypertension Sister   . Alzheimer's disease Paternal Aunt   . Alzheimer's disease Paternal Uncle   . Cancer Sister     Hodgkin's Disease (16); Breast Cancer (Bilateral Mastectomy)   . Hypertension Sister   . Colon cancer Neg Hx   . Esophageal cancer Neg Hx   . Stomach cancer Neg Hx   . Rectal cancer Neg Hx   . Diabetes Neg Hx     Social History   Social History  . Marital Status: Divorced    Spouse Name: N/A  . Number of Children: N/A  . Years of Education: N/A   Occupational History  . CPA      [employer]   Social History Main Topics  . Smoking status: Never Smoker   . Smokeless tobacco: Never Used  . Alcohol Use: 1.8 oz/week    3 Cans of beer per week     Comment: Ocasionally   . Drug Use: No  . Sexual Activity: Not Currently   Other Topics Concern  . 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  All other ROS are negative.  BP 110/68 mmHg  Pulse 55  Temp(Src) 97.8 F (36.6 C) (Oral)  Ht 6\' 1"  (1.854 m)  Wt 198 lb 12.8 oz (90.175 kg)  BMI 26.23 kg/m2  SpO2 98%  Physical Exam  Constitutional: He is oriented to person, place, and time and well-developed, well-nourished, and in no distress.  HENT:  Head: Normocephalic and atraumatic.  Cardiovascular: Normal rate, regular rhythm, normal heart sounds and intact distal pulses.   Pulmonary/Chest: Effort normal.  Genitourinary: Prostate normal and testes/scrotum normal. Penis exhibits no lesions. No discharge found.  Neurological: He is alert  and oriented to person, place, and time.  Skin: Skin is warm.  Vitals reviewed.   Assessment/Plan: High risk sexual behavior Exam unremarkable. Will obtain STI panel today. Patient has had repeat visits for STD screen. Discussed risks of high-risk sexual behavior and consistent condom use. Discussed with patient that some STD can still be transmitted via skin even with condom use so limiting sexual partners is important.

## 2015-11-24 LAB — RPR

## 2015-11-24 LAB — GC/CHLAMYDIA PROBE AMP
CT PROBE, AMP APTIMA: NOT DETECTED
GC PROBE AMP APTIMA: NOT DETECTED

## 2015-11-26 LAB — HSV(HERPES SMPLX)ABS-I+II(IGG+IGM)-BLD
HSV 1 Glycoprotein G Ab, IgG: 16.6 Index — ABNORMAL HIGH (ref <0.90)
Herpes Simplex Vrs I&II-IgM Ab (EIA): 0.22 INDEX

## 2016-01-21 DIAGNOSIS — D1801 Hemangioma of skin and subcutaneous tissue: Secondary | ICD-10-CM | POA: Diagnosis not present

## 2016-01-21 DIAGNOSIS — D225 Melanocytic nevi of trunk: Secondary | ICD-10-CM | POA: Diagnosis not present

## 2016-01-21 DIAGNOSIS — Z85828 Personal history of other malignant neoplasm of skin: Secondary | ICD-10-CM | POA: Diagnosis not present

## 2016-01-21 DIAGNOSIS — Z08 Encounter for follow-up examination after completed treatment for malignant neoplasm: Secondary | ICD-10-CM | POA: Diagnosis not present

## 2016-04-21 ENCOUNTER — Other Ambulatory Visit: Payer: Self-pay | Admitting: Physician Assistant

## 2016-06-03 ENCOUNTER — Other Ambulatory Visit: Payer: Self-pay | Admitting: Physician Assistant

## 2016-06-15 ENCOUNTER — Encounter: Payer: Self-pay | Admitting: Physician Assistant

## 2016-06-15 ENCOUNTER — Ambulatory Visit (INDEPENDENT_AMBULATORY_CARE_PROVIDER_SITE_OTHER): Payer: BLUE CROSS/BLUE SHIELD | Admitting: Physician Assistant

## 2016-06-15 VITALS — BP 120/80 | HR 61 | Temp 98.5°F | Resp 16 | Ht 73.0 in | Wt 211.0 lb

## 2016-06-15 DIAGNOSIS — I10 Essential (primary) hypertension: Secondary | ICD-10-CM

## 2016-06-15 MED ORDER — LISINOPRIL 10 MG PO TABS: ORAL_TABLET | ORAL | 3 refills | Status: DC

## 2016-06-15 NOTE — Patient Instructions (Signed)
Please go to the lab for blood work. I will call you with your results.  Continue chronic medications as directed.  Follow-up in 6 months.  Start to work on cutting out the sodas.  Work on cutting down on [employer] and increasing lean protein and fiber.

## 2016-06-15 NOTE — Progress Notes (Signed)
Patient presents to clinic today c/o for follow-up of hypertension. Patient is currently on a regimen of lisinopril 10 mg daily. Endorses taking medications as directed. Patient denies chest pain, palpitations, lightheadedness, dizziness, vision changes or frequent headaches.  BP Readings from Last 3 Encounters:  06/15/16 120/80  11/23/15 110/68  06/17/15 130/80    Past Medical History:  Diagnosis Date  . Bell's palsy 2003  . Hypertension     Current Outpatient Prescriptions on File Prior to Visit  Medication Sig Dispense Refill  . Multiple Vitamin (MULTIVITAMIN) tablet Take 1 tablet by mouth every other day.    . sildenafil (REVATIO) 20 MG tablet TAKE 2-5 TABLETS BY MOUTH AS NEEDED SEXUAL ACTIVITY 50 tablet 0   No current facility-administered medications on file prior to visit.     No Known Allergies  Family History  Problem Relation Age of Onset  . COPD Mother     Non Smoker (25)   . Hypertension Mother   . Heart disease Father     CABG (3-4 vessel)   . Hypertension Sister   . Alzheimer's disease Paternal Aunt   . Alzheimer's disease Paternal Uncle   . Cancer Sister     Hodgkin's Disease (16); Breast Cancer (Bilateral Mastectomy)   . Hypertension Sister   . Colon cancer Neg Hx   . Esophageal cancer Neg Hx   . Stomach cancer Neg Hx   . Rectal cancer Neg Hx   . Diabetes Neg Hx     Social History   Social History  . Marital status: Divorced    Spouse name: N/A  . Number of children: N/A  . Years of education: N/A   Occupational History  . CPA  Other - [employer]   Social History Main Topics  . Smoking status: Never Smoker  . Smokeless tobacco: Never Used  . Alcohol use 1.8 oz/week    3 Cans of beer per week     Comment: Ocasionally   . Drug use: No  . Sexual activity: Not Currently   Other Topics Concern  . 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  All other ROS are negative.  BP 120/80 (BP Location: Left Arm, Patient Position: Sitting, Cuff Size: Large)   Pulse 61   Temp 98.5 F (36.9 C) (Oral)   Resp 16   Ht 6\' 1"  (1.854 m)   Wt 211 lb (95.7 kg)   SpO2 98%   BMI 27.84 kg/m   Physical Exam  Constitutional: He is oriented to person, place, and time and well-developed, well-nourished, and in no distress.  Cardiovascular: Normal rate, regular rhythm, normal heart sounds and intact distal pulses.   Pulmonary/Chest: Effort normal and breath sounds normal. No respiratory distress. He has no wheezes. He has no rales.  He exhibits no tenderness.  Neurological: He is alert and oriented to person, place, and time.  Skin: Skin is warm and dry. No rash noted.  Psychiatric: Affect normal.  Vitals reviewed.   Recent Results (from the past 2160 hour(s))  Basic metabolic panel     Status: None   Collection Time: 06/15/16  5:45 PM  Result Value Ref Range   Sodium 139 135 - 145 mEq/L   Potassium 4.2 3.5 - 5.1 mEq/L   Chloride 104 96 - 112 mEq/L   CO2 27 19 - 32 mEq/L   Glucose, Bld 85 70 - 99 mg/dL   BUN 23 6 - 23 mg/dL   Creatinine, Ser 1.15 0.40 - 1.50 mg/dL   Calcium 9.5 8.4 - 10.5 mg/dL   GFR 70.66 >60.00 mL/min    Assessment/Plan: Essential hypertension, benign BP normotensive. Asymptomatic. Will check labs today. Continue current medication regimen.    Leeanne Rio, PA-C

## 2016-06-15 NOTE — Progress Notes (Signed)
Pre visit review using our clinic review tool, if applicable. No additional management support is needed unless otherwise documented below in the visit note/SLS  

## 2016-06-16 LAB — BASIC METABOLIC PANEL
BUN: 23 mg/dL (ref 6–23)
CHLORIDE: 104 meq/L (ref 96–112)
CO2: 27 meq/L (ref 19–32)
CREATININE: 1.15 mg/dL (ref 0.40–1.50)
Calcium: 9.5 mg/dL (ref 8.4–10.5)
GFR: 70.66 mL/min (ref >60.00)
Glucose, Bld: 85 mg/dL (ref 70–99)
POTASSIUM: 4.2 meq/L (ref 3.5–5.1)
SODIUM: 139 meq/L (ref 135–145)

## 2016-06-16 NOTE — Assessment & Plan Note (Signed)
BP normotensive. Asymptomatic. Will check labs today. Continue current medication regimen.

## 2016-09-14 ENCOUNTER — Ambulatory Visit (INDEPENDENT_AMBULATORY_CARE_PROVIDER_SITE_OTHER): Payer: BLUE CROSS/BLUE SHIELD | Admitting: Internal Medicine

## 2016-09-14 ENCOUNTER — Encounter: Payer: Self-pay | Admitting: Internal Medicine

## 2016-09-14 ENCOUNTER — Telehealth: Payer: Self-pay | Admitting: Physician Assistant

## 2016-09-14 VITALS — BP 120/72 | HR 69 | Temp 97.9°F | Resp 14 | Ht 73.0 in | Wt 210.1 lb

## 2016-09-14 DIAGNOSIS — J069 Acute upper respiratory infection, unspecified: Secondary | ICD-10-CM | POA: Diagnosis not present

## 2016-09-14 MED ORDER — BENZONATATE 200 MG PO CAPS: 200.0000 mg | ORAL_CAPSULE | Freq: Three times a day (TID) | ORAL | 0 refills | Status: DC | PRN

## 2016-09-14 NOTE — Progress Notes (Signed)
Pre visit review using our clinic review tool, if applicable. No additional management support is needed unless otherwise documented below in the visit note. 

## 2016-09-14 NOTE — Telephone Encounter (Signed)
Patient called stating he has upper chest congestion and is having troubles swallowing, he wanted to get an appointment. Sent to [facility] to be triaged.

## 2016-09-14 NOTE — Progress Notes (Signed)
Subjective:    Patient ID: Maurice Hernandez, male    DOB: 1961-03-04, 56 y.o.   MRN: [id number]  DOS:  09/14/2016 Type of visit - description : acute Interval history:  Sx started 2 days ago with sinus, throat and upper chest congestion, sometimes upper chest feeling like is burning. Cough is mild to moderate, described as hacking. No SS CP. Not taking any medication for the symptoms. Few days ago went to the gym, it was crowded and he wonders if he was in contact with somebody sick.  Review of Systems  No fever chills Mildly tired No nausea, vomiting or myalgias. Past Medical History:  Diagnosis Date  . Bell's palsy 2003  . Hypertension     Past Surgical History:  Procedure Laterality Date  . INGUINAL HERNIA REPAIR Left 1968  . INGUINAL HERNIA REPAIR Right 2001  . WISDOM TOOTH EXTRACTION      Social History   Social History  . Marital status: Divorced    Spouse name: N/A  . Number of children: N/A  . Years of education: N/A   Occupational History  . CPA  Other - [employer]   Social History Main Topics  . Smoking status: Never Smoker  . Smokeless tobacco: Never Used  . Alcohol use 1.8 oz/week    3 Cans of beer per week     Comment: Ocasionally   . Drug use: No  . Sexual activity: Not Currently   Other Topics Concern  . 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 Always use your most recent med list.          benzonatate 200 MG  capsule Commonly known as:  TESSALON Take 1 capsule (200 mg total) by mouth 3 (three) times daily as needed for cough.   lisinopril 10 MG tablet Commonly known as:  PRINIVIL,ZESTRIL TAKE 1 TABLET (10 MG TOTAL) BY MOUTH DAILY.   multivitamin tablet Take 1 tablet by mouth every other day.   sildenafil 20 MG tablet Commonly known as:  REVATIO TAKE 2-5 TABLETS BY MOUTH AS NEEDED SEXUAL ACTIVITY          Objective:   Physical Exam BP 120/72 (BP Location: Left Arm, Patient Position: Sitting, Cuff Size: Normal)   Pulse 69   Temp 97.9 F (36.6 C) (Oral)   Resp 14   Ht 6\' 1"  (1.854 m)   Wt 210 lb 2 oz (95.3 kg)   SpO2 98%   BMI 27.72 kg/m  General:   Well developed, well nourished . NAD.  HEENT:  Normocephalic . Face symmetric, atraumatic. TMs slightly bulge, worse on the right, no red;no  discharge Nose slightly congested. Throat not red, symmetric. Lungs:  CTA B Normal respiratory effort, no intercostal retractions, no accessory muscle use. Heart: RRR,  no murmur.  No pretibial edema bilaterally  Skin: Not pale. Not jaundice Neurologic:  alert & oriented X3.  Speech normal, gait appropriate for age and unassisted Psych--  Cognition and judgment appear intact.  Cooperative with normal attention span and concentration.  Behavior appropriate. No anxious or depressed appearing.      Assessment & Plan:     56 year old gentleman with history of HTN, Bell's palsy presents with the following problems: URI:  Sx  C/W a URI, no flu on clinical grounds.  Rx conservative treatment, see instructions. Call if no better. ABX?

## 2016-09-14 NOTE — Telephone Encounter (Signed)
Patient called back stating that he spoke with the nurse and stated that he needs to be seen today for poss. Strep. He stated that the nurse did not say he needed to go to the ED patient scheduled for today 09/14/16 with Dr. Larose Kells. No note in the chart from [facility] at this point.

## 2016-09-14 NOTE — Telephone Encounter (Signed)
Patient Name: Maurice Hernandez  Gender: Male  DOB: June 20, 1961   Age: 56 Y 27 M 21 D  Return Phone Number: [phone] (Primary)  Address:   City/State/Zip: [place]    Client [facility] - Client  Client Site [facility] - Day  Physician Elyn Aquas - PA  Contact Type Call  Who Is Calling Patient / Member / Family / Caregiver  Call Type Triage / Clinical  Relationship To Patient Self  Return Phone Number [phone] (Primary)  Chief Complaint Swallowing Difficulty  Reason for Call Symptomatic / Request for Health Information  Initial Comment caller states he has difficulty swallowing, cough and congestion  Translation No   Nurse Assessment      Guidelines      Guideline Title Affirmed Question Affirmed Notes Nurse Date/Time (Eastern Time)         Disp. Time Eilene Ghazi Time) Disposition Final User                Comments  User: Ave Filter, RN Date/Time (Eastern Time): 09/14/2016 10:05:30 AM  Caller states that he has already set up an appt for this afternoon, he does not need to speak with a nurse.

## 2016-09-14 NOTE — Telephone Encounter (Signed)
Spoke with patient he is having upper congestion and sore throat. Advised patient he would need an appointment to be evaluated. He states he wanted to see a provider in [facility] location. Advised to contact the [facility] location to schedule an appointment. He is agreeable.

## 2016-09-14 NOTE — Patient Instructions (Signed)
Rest, fluids , tylenol  For cough:  Take Mucinex DM twice a day as needed until better  For nasal congestion: Use OTC Nasocort or Flonase : 2 nasal sprays on each side of the nose in the morning until you feel better  Avoid decongestants such as  Pseudoephedrine or phenylephrine   Call if not gradually better over the next  10 days  Call anytime if the symptoms are severe 

## 2016-10-20 DIAGNOSIS — I219 Acute myocardial infarction, unspecified: Secondary | ICD-10-CM

## 2016-10-20 HISTORY — DX: Acute myocardial infarction, unspecified: I21.9

## 2016-11-15 DIAGNOSIS — I119 Hypertensive heart disease without heart failure: Secondary | ICD-10-CM | POA: Diagnosis not present

## 2016-11-15 DIAGNOSIS — Z6828 Body mass index (BMI) 28.0-28.9, adult: Secondary | ICD-10-CM | POA: Diagnosis not present

## 2016-11-15 DIAGNOSIS — I517 Cardiomegaly: Secondary | ICD-10-CM | POA: Diagnosis not present

## 2016-11-15 DIAGNOSIS — Z955 Presence of coronary angioplasty implant and graft: Secondary | ICD-10-CM | POA: Diagnosis not present

## 2016-11-15 DIAGNOSIS — G4700 Insomnia, unspecified: Secondary | ICD-10-CM | POA: Diagnosis not present

## 2016-11-15 DIAGNOSIS — I214 Non-ST elevation (NSTEMI) myocardial infarction: Secondary | ICD-10-CM | POA: Diagnosis not present

## 2016-11-15 DIAGNOSIS — I10 Essential (primary) hypertension: Secondary | ICD-10-CM | POA: Diagnosis not present

## 2016-11-15 DIAGNOSIS — Z79899 Other long term (current) drug therapy: Secondary | ICD-10-CM | POA: Diagnosis not present

## 2016-11-15 DIAGNOSIS — R079 Chest pain, unspecified: Secondary | ICD-10-CM | POA: Diagnosis not present

## 2016-11-15 DIAGNOSIS — Z6827 Body mass index (BMI) 27.0-27.9, adult: Secondary | ICD-10-CM | POA: Diagnosis not present

## 2016-11-15 DIAGNOSIS — Z8249 Family history of ischemic heart disease and other diseases of the circulatory system: Secondary | ICD-10-CM | POA: Diagnosis not present

## 2016-11-15 DIAGNOSIS — I25118 Atherosclerotic heart disease of native coronary artery with other forms of angina pectoris: Secondary | ICD-10-CM | POA: Diagnosis not present

## 2016-11-15 DIAGNOSIS — R0789 Other chest pain: Secondary | ICD-10-CM | POA: Diagnosis not present

## 2016-11-15 DIAGNOSIS — F419 Anxiety disorder, unspecified: Secondary | ICD-10-CM | POA: Diagnosis not present

## 2016-11-15 DIAGNOSIS — R0602 Shortness of breath: Secondary | ICD-10-CM | POA: Diagnosis not present

## 2016-11-15 DIAGNOSIS — R001 Bradycardia, unspecified: Secondary | ICD-10-CM | POA: Diagnosis not present

## 2016-11-15 DIAGNOSIS — I2510 Atherosclerotic heart disease of native coronary artery without angina pectoris: Secondary | ICD-10-CM | POA: Diagnosis not present

## 2016-11-16 DIAGNOSIS — Z955 Presence of coronary angioplasty implant and graft: Secondary | ICD-10-CM | POA: Diagnosis not present

## 2016-11-16 DIAGNOSIS — I25118 Atherosclerotic heart disease of native coronary artery with other forms of angina pectoris: Secondary | ICD-10-CM | POA: Diagnosis not present

## 2016-11-16 DIAGNOSIS — F419 Anxiety disorder, unspecified: Secondary | ICD-10-CM | POA: Diagnosis not present

## 2016-11-16 DIAGNOSIS — I119 Hypertensive heart disease without heart failure: Secondary | ICD-10-CM | POA: Diagnosis not present

## 2016-11-16 DIAGNOSIS — I214 Non-ST elevation (NSTEMI) myocardial infarction: Secondary | ICD-10-CM | POA: Diagnosis not present

## 2016-11-16 DIAGNOSIS — Z6828 Body mass index (BMI) 28.0-28.9, adult: Secondary | ICD-10-CM | POA: Diagnosis not present

## 2016-11-24 ENCOUNTER — Ambulatory Visit (INDEPENDENT_AMBULATORY_CARE_PROVIDER_SITE_OTHER): Payer: BLUE CROSS/BLUE SHIELD | Admitting: Internal Medicine

## 2016-11-24 ENCOUNTER — Encounter: Payer: Self-pay | Admitting: Internal Medicine

## 2016-11-24 VITALS — BP 126/74 | HR 56 | Temp 97.6°F | Resp 14 | Ht 73.0 in | Wt 203.0 lb

## 2016-11-24 DIAGNOSIS — I2510 Atherosclerotic heart disease of native coronary artery without angina pectoris: Secondary | ICD-10-CM | POA: Diagnosis not present

## 2016-11-24 DIAGNOSIS — F411 Generalized anxiety disorder: Secondary | ICD-10-CM | POA: Diagnosis not present

## 2016-11-24 LAB — COMPREHENSIVE METABOLIC PANEL
ALBUMIN: 4.7 g/dL (ref 3.5–5.2)
ALK PHOS: 62 U/L (ref 39–117)
ALT: 27 U/L (ref 0–53)
AST: 23 U/L (ref 0–37)
BUN: 20 mg/dL (ref 6–23)
CO2: 29 mEq/L (ref 19–32)
CREATININE: 1.07 mg/dL (ref 0.40–1.50)
Calcium: 9.8 mg/dL (ref 8.4–10.5)
Chloride: 102 mEq/L (ref 96–112)
GFR: 76.67 mL/min (ref >60.00)
Glucose, Bld: 85 mg/dL (ref 70–99)
POTASSIUM: 4.1 meq/L (ref 3.5–5.1)
SODIUM: 135 meq/L (ref 135–145)
Total Bilirubin: 0.8 mg/dL (ref 0.2–1.2)
Total Protein: 7.7 g/dL (ref 6.0–8.3)

## 2016-11-24 LAB — CBC WITH DIFFERENTIAL/PLATELET
BASOS ABS: 0 10*3/uL (ref 0.0–0.1)
Basophils Relative: 0.5 % (ref 0.0–3.0)
EOS ABS: 0.1 10*3/uL (ref 0.0–0.7)
Eosinophils Relative: 2.3 % (ref 0.0–5.0)
HCT: 44.2 % (ref 39.0–52.0)
HEMOGLOBIN: 15.2 g/dL (ref 13.0–17.0)
Lymphocytes Relative: 29.3 % (ref 12.0–46.0)
Lymphs Abs: 1.5 10*3/uL (ref 0.7–4.0)
MCHC: 34.3 g/dL (ref 30.0–36.0)
MCV: 90.4 fl (ref 78.0–100.0)
MONO ABS: 0.6 10*3/uL (ref 0.1–1.0)
Monocytes Relative: 10.7 % (ref 3.0–12.0)
Neutro Abs: 2.9 10*3/uL (ref 1.4–7.7)
Neutrophils Relative %: 57.2 % (ref 43.0–77.0)
Platelets: 235 10*3/uL (ref 150.0–400.0)
RBC: 4.89 Mil/uL (ref 4.22–5.81)
RDW: 13.3 % (ref 11.5–15.5)
WBC: 5.1 10*3/uL (ref 4.0–10.5)

## 2016-11-24 LAB — HEMOGLOBIN A1C: HEMOGLOBIN A1C: 5.7 % (ref 4.6–6.5)

## 2016-11-24 NOTE — Progress Notes (Signed)
Subjective:    Patient ID: Maurice Hernandez, male    DOB: 1961/08/09, 56 y.o.   MRN: [id number]  DOS:  11/24/2016 Type of visit - description :  Interval history: Admitted to an outside hospital recently: Had chest pain, for 6 weeks, went to the ER, troponins were elevated, EKG with no acute changes, diagnosed with.non-STEMI.  Echocardiogram 11/15/2016 Cardiac catheterization 11/15/2016 Labs: CBC normal, creatinine 1.1, potassium 3.9, LFTs normal   Review of Systems Since he left the hospital he is feeling well. No chest pain or difficulty breathing No nausea, vomiting, diarrhea. No diaphoresis No blood in the stools or in the urine. He was anxious when he was in the hospital, taking BuSpar with good response. Also, is sleeping better now.  Past Medical History:  Diagnosis Date  . Bell's palsy 2003  . CAD (coronary artery disease)    NSTEMI 11-15-16  . Hypertension   . Myocardial infarction 10/2016    Past Surgical History:  Procedure Laterality Date  . INGUINAL HERNIA REPAIR Left 1968  . INGUINAL HERNIA REPAIR Right 2001  . WISDOM TOOTH EXTRACTION      Social History   Social History  . Marital status: Divorced    Spouse name: N/A  . Number of children: N/A  . Years of education: N/A   Occupational History  . CPA  Other - [employer]   Social History Main Topics  . Smoking status: Never Smoker  . Smokeless tobacco: Never Used  . Alcohol use 1.8 oz/week    3 Cans of beer per week     Comment: Ocasionally   . Drug use: No  . Sexual activity: Not Currently   Other Topics Concern  . 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 Always use your most recent med list.          aspirin EC 81 MG tablet Take 81 mg by mouth daily.   atorvastatin 40 MG tablet Commonly known as:  LIPITOR Take 40 mg by mouth every evening.   BRILINTA 90 MG Tabs tablet Generic drug:  ticagrelor Take 1 tablet by mouth 2 (two) times daily.   busPIRone 5 MG tablet Commonly known as:  BUSPAR Take 5 mg by mouth 2 (two) times daily.   lisinopril 10 MG tablet Commonly known as:  PRINIVIL,ZESTRIL Take 10 mg by mouth daily.   metoprolol tartrate 25 MG tablet Commonly known as:  LOPRESSOR Take 25 mg by mouth 2 (two) times daily.   MULTI-VITAMINS Tabs Take 1 tablet by mouth daily.   nitroGLYCERIN 0.4 MG SL tablet Commonly known as:  NITROSTAT Place 0.4 mg under the  tongue every 5 (five) minutes x 3 doses as needed.   sildenafil 20 MG tablet Commonly known as:  REVATIO TAKE 2-5 TABLETS BY MOUTH AS NEEDED SEXUAL ACTIVITY   traZODone 50 MG tablet Commonly known as:  DESYREL Take 50 mg by mouth at bedtime as needed.          Objective:   Physical Exam BP 126/74 (BP Location: Left Arm, Patient Position: Sitting, Cuff Size: Normal)   Pulse (!) 56   Temp 97.6 F (36.4 C) (Oral)   Resp 14   Ht 6\' 1"  (1.854 m)   Wt 203 lb (92.1 kg)   SpO2 97%   BMI 26.78 kg/m  General:   Well developed, well nourished . NAD.  HEENT:  Normocephalic . Face symmetric, atraumatic Lungs:  CTA B Normal respiratory effort, no intercostal retractions, no accessory muscle use. Heart: RRR,  no murmur.  no pretibial edema bilaterally  Abdomen:  Not distended, soft, non-tender. No rebound or rigidity.  Skin: Not pale. Not jaundice Neurologic:  alert & oriented X3.  Speech normal, gait appropriate for age and unassisted Psych--  Cognition and  judgment appear intact.  Cooperative with normal attention span and concentration.  Behavior appropriate. No anxious or depressed appearing.    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  PLAN: CAD: Admitted last month, non-STEMI, had DES LAD and Cx 11/15/2016. Currently on aspirin, Brilinta, metoprolol, Lipitor, lisinopril. He is doing well with no SOB,chest pain. Until he sees cardiology next week recommend only light activity with ADLs. Will check a CMP, CBC and A1c. ER if chest pain, difficulty breathing or diaphoresis Anxiety, insomnia: some anxiety while in the hospital, currently well controlled with BuSpar. Does not require any medications for sleep but will keep trazodone in his medication list just in case. ED: Has Viagra at home, recommend not to take it for now RTC 4 months.

## 2016-11-24 NOTE — Progress Notes (Signed)
Pre visit review using our clinic review tool, if applicable. No additional management support is needed unless otherwise documented below in the visit note. 

## 2016-11-24 NOTE — Patient Instructions (Signed)
GO TO THE LAB : Get the blood work     GO TO THE FRONT DESK Schedule your next appointment for a  4 months  Follow up with Dr Larose Kells

## 2016-11-29 DIAGNOSIS — I2510 Atherosclerotic heart disease of native coronary artery without angina pectoris: Secondary | ICD-10-CM | POA: Insufficient documentation

## 2016-11-29 DIAGNOSIS — Z09 Encounter for follow-up examination after completed treatment for conditions other than malignant neoplasm: Secondary | ICD-10-CM | POA: Insufficient documentation

## 2016-11-29 NOTE — Assessment & Plan Note (Signed)
CAD: Admitted last month, non-STEMI, had DES LAD and Cx 11/15/2016. Currently on aspirin, Brilinta, metoprolol, Lipitor, lisinopril. He is doing well with no SOB,chest pain. Until he sees cardiology next week recommend only light activity with ADLs. Will check a CMP, CBC and A1c. ER if chest pain, difficulty breathing or diaphoresis Anxiety, insomnia: some anxiety while in the hospital, currently well controlled with BuSpar. Does not require any medications for sleep but will keep trazodone in his medication list just in case. ED: Has Viagra at home, recommend not to take it for now RTC 4 months.

## 2016-11-30 DIAGNOSIS — I214 Non-ST elevation (NSTEMI) myocardial infarction: Secondary | ICD-10-CM | POA: Diagnosis not present

## 2016-11-30 DIAGNOSIS — Z6826 Body mass index (BMI) 26.0-26.9, adult: Secondary | ICD-10-CM | POA: Diagnosis not present

## 2016-12-14 ENCOUNTER — Other Ambulatory Visit: Payer: Self-pay | Admitting: Internal Medicine

## 2016-12-15 ENCOUNTER — Other Ambulatory Visit: Payer: Self-pay

## 2016-12-15 MED ORDER — BRILINTA 90 MG PO TABS: 90.0000 mg | ORAL_TABLET | Freq: Two times a day (BID) | ORAL | 0 refills | Status: DC

## 2016-12-30 DIAGNOSIS — I2510 Atherosclerotic heart disease of native coronary artery without angina pectoris: Secondary | ICD-10-CM | POA: Diagnosis not present

## 2016-12-30 DIAGNOSIS — E7800 Pure hypercholesterolemia, unspecified: Secondary | ICD-10-CM | POA: Diagnosis not present

## 2016-12-30 DIAGNOSIS — I10 Essential (primary) hypertension: Secondary | ICD-10-CM | POA: Diagnosis not present

## 2016-12-30 DIAGNOSIS — Z7982 Long term (current) use of aspirin: Secondary | ICD-10-CM | POA: Diagnosis not present

## 2017-01-10 DIAGNOSIS — D225 Melanocytic nevi of trunk: Secondary | ICD-10-CM | POA: Diagnosis not present

## 2017-01-10 DIAGNOSIS — L814 Other melanin hyperpigmentation: Secondary | ICD-10-CM | POA: Diagnosis not present

## 2017-01-10 DIAGNOSIS — D2339 Other benign neoplasm of skin of other parts of face: Secondary | ICD-10-CM | POA: Diagnosis not present

## 2017-01-11 DIAGNOSIS — Z7982 Long term (current) use of aspirin: Secondary | ICD-10-CM | POA: Diagnosis not present

## 2017-01-11 DIAGNOSIS — Z79899 Other long term (current) drug therapy: Secondary | ICD-10-CM | POA: Diagnosis not present

## 2017-01-11 DIAGNOSIS — S7011XA Contusion of right thigh, initial encounter: Secondary | ICD-10-CM | POA: Diagnosis not present

## 2017-01-11 DIAGNOSIS — Z955 Presence of coronary angioplasty implant and graft: Secondary | ICD-10-CM | POA: Diagnosis not present

## 2017-01-11 DIAGNOSIS — I252 Old myocardial infarction: Secondary | ICD-10-CM | POA: Diagnosis not present

## 2017-01-11 DIAGNOSIS — S8011XA Contusion of right lower leg, initial encounter: Secondary | ICD-10-CM | POA: Diagnosis not present

## 2017-01-11 DIAGNOSIS — I2510 Atherosclerotic heart disease of native coronary artery without angina pectoris: Secondary | ICD-10-CM | POA: Diagnosis not present

## 2017-01-11 DIAGNOSIS — M79604 Pain in right leg: Secondary | ICD-10-CM | POA: Diagnosis not present

## 2017-01-11 DIAGNOSIS — I10 Essential (primary) hypertension: Secondary | ICD-10-CM | POA: Diagnosis not present

## 2017-01-23 ENCOUNTER — Other Ambulatory Visit: Payer: Self-pay | Admitting: Internal Medicine

## 2017-03-24 ENCOUNTER — Other Ambulatory Visit: Payer: Self-pay

## 2017-03-24 MED ORDER — BUSPIRONE HCL 5 MG PO TABS: 5.0000 mg | ORAL_TABLET | Freq: Two times a day (BID) | ORAL | 0 refills | Status: DC

## 2017-04-14 DIAGNOSIS — I2510 Atherosclerotic heart disease of native coronary artery without angina pectoris: Secondary | ICD-10-CM | POA: Diagnosis not present

## 2017-04-14 DIAGNOSIS — Z7982 Long term (current) use of aspirin: Secondary | ICD-10-CM | POA: Diagnosis not present

## 2017-04-14 DIAGNOSIS — I10 Essential (primary) hypertension: Secondary | ICD-10-CM | POA: Diagnosis not present

## 2017-04-14 DIAGNOSIS — E7800 Pure hypercholesterolemia, unspecified: Secondary | ICD-10-CM | POA: Diagnosis not present

## 2017-06-19 ENCOUNTER — Other Ambulatory Visit: Payer: Self-pay | Admitting: Internal Medicine

## 2017-07-06 ENCOUNTER — Telehealth: Payer: Self-pay | Admitting: Internal Medicine

## 2017-07-06 MED ORDER — ESCITALOPRAM OXALATE 10 MG PO TABS: 10.0000 mg | ORAL_TABLET | Freq: Every day | ORAL | 2 refills | Status: DC

## 2017-07-06 NOTE — Telephone Encounter (Signed)
Patty from [facility] called to speak with PCP nurse due to Buspirone not in stock and wanted to see if it could be chnge. Please call back ASAP. @ [phone].

## 2017-07-06 NOTE — Telephone Encounter (Signed)
He takes buspirone for anxiety, if he is unable to get that medication at his pharmacy or any other pharmacy, then changed to Lexapro 10 mg 1 tablet daily.   #30, 2 refills  Lexapro is not exactly the same  But is very good for anxiety, watch for side effects such as nausea, diarrhea or somnolence.  If that is not working for him, please let me know

## 2017-07-06 NOTE — Telephone Encounter (Signed)
[facility] [employer], Buspar on back order in multiple pharmacies. Verbal order given to pharmacy for Lexapro 10mg . LMOM informing Pt of med change. Instructed to call if questions/concerns.

## 2017-07-06 NOTE — Telephone Encounter (Signed)
Please advise 

## 2017-07-18 DIAGNOSIS — I10 Essential (primary) hypertension: Secondary | ICD-10-CM | POA: Diagnosis not present

## 2017-07-18 DIAGNOSIS — I2510 Atherosclerotic heart disease of native coronary artery without angina pectoris: Secondary | ICD-10-CM | POA: Diagnosis not present

## 2017-07-18 DIAGNOSIS — Z7982 Long term (current) use of aspirin: Secondary | ICD-10-CM | POA: Diagnosis not present

## 2017-07-18 DIAGNOSIS — E782 Mixed hyperlipidemia: Secondary | ICD-10-CM | POA: Diagnosis not present

## 2017-07-21 DIAGNOSIS — I2510 Atherosclerotic heart disease of native coronary artery without angina pectoris: Secondary | ICD-10-CM | POA: Diagnosis not present

## 2017-07-21 DIAGNOSIS — I10 Essential (primary) hypertension: Secondary | ICD-10-CM | POA: Diagnosis not present

## 2017-07-21 DIAGNOSIS — Z7982 Long term (current) use of aspirin: Secondary | ICD-10-CM | POA: Diagnosis not present

## 2017-07-21 DIAGNOSIS — E782 Mixed hyperlipidemia: Secondary | ICD-10-CM | POA: Diagnosis not present

## 2017-08-28 ENCOUNTER — Other Ambulatory Visit: Payer: Self-pay

## 2017-08-28 MED ORDER — ESCITALOPRAM OXALATE 10 MG PO TABS: 10.0000 mg | ORAL_TABLET | Freq: Every day | ORAL | 0 refills | Status: DC

## 2017-09-26 ENCOUNTER — Encounter: Payer: Self-pay | Admitting: Internal Medicine

## 2017-11-19 ENCOUNTER — Other Ambulatory Visit: Payer: Self-pay | Admitting: Internal Medicine

## 2017-12-21 ENCOUNTER — Ambulatory Visit: Payer: BLUE CROSS/BLUE SHIELD | Admitting: Physician Assistant

## 2017-12-22 ENCOUNTER — Encounter: Payer: Self-pay | Admitting: Physician Assistant

## 2017-12-22 ENCOUNTER — Ambulatory Visit (INDEPENDENT_AMBULATORY_CARE_PROVIDER_SITE_OTHER): Payer: BLUE CROSS/BLUE SHIELD | Admitting: Physician Assistant

## 2017-12-22 ENCOUNTER — Encounter (INDEPENDENT_AMBULATORY_CARE_PROVIDER_SITE_OTHER): Payer: Self-pay

## 2017-12-22 VITALS — BP 108/60 | HR 52 | Ht 73.0 in | Wt 193.4 lb

## 2017-12-22 DIAGNOSIS — Z7901 Long term (current) use of anticoagulants: Secondary | ICD-10-CM | POA: Diagnosis not present

## 2017-12-22 DIAGNOSIS — Z86010 Personal history of colonic polyps: Secondary | ICD-10-CM | POA: Diagnosis not present

## 2017-12-22 DIAGNOSIS — Z1211 Encounter for screening for malignant neoplasm of colon: Secondary | ICD-10-CM

## 2017-12-22 MED ORDER — NA SULFATE-K SULFATE-MG SULF 17.5-3.13-1.6 GM/177ML PO SOLN: ORAL | 0 refills | Status: DC

## 2017-12-22 NOTE — Patient Instructions (Addendum)
We will contact you with the Brilinta clearance directions from Dr. Beatrix Fetters.   You have been scheduled for a colonoscopy. Please follow written instructions given to you at your visit today.  Please pick up your prep supplies at the pharmacy within the next 1-3 days. [facility] [place], [place], [place].  If you use inhalers (even only as needed), please bring them with you on the day of your procedure. Your physician has requested that you go to www.startemmi.com and enter the access code given to you at your visit today. This web site gives a general overview about your procedure. However, you should still follow specific instructions given to you by our office regarding your preparation for the procedure.  If you are age 55 or older, your body mass index should be between 23-30. Your Body mass index is 25.52 kg/m. If this is out of the aforementioned range listed, please consider follow up with your Primary Care Provider.

## 2017-12-22 NOTE — Progress Notes (Addendum)
Subjective:    Patient ID: Maurice Hernandez, male    DOB: Sep 16, 1960, 57 y.o.   MRN: [id number]  HPI "Maurice Hernandez" is a very nice 57 year old white male, known to Dr. Hilarie Fredrickson who comes in today to discuss follow-up colonoscopy. Patient has history of colon polyps. He underwent colonoscopy in January 2016 with finding of 5 sessile polyps with mucus caps, these all measured 3-6 mm and path was consistent with tubular adenomas. Also noted to have internal hemorrhoids. He was recommended to have a 3 year interval follow-up. Patient has history of hypertension, coronary artery disease, and is status post MI in March 2018 with subsequent drug-eluting stent to the LAD and left circumflex. He is being maintained on baby aspirin and Brilinta. His cardiologist is Dr. Jeneen Rinks McGukin in [place]. Patient says he has not had any problems since stent placement, and has been focused on eating right, exercising and reducing his risk factors.  He denies any abdominal discomfort, changes in bowel habits melena or hematochezia.  Review of Systems Pertinent positive and negative review of systems were noted in the above HPI section.  All other review of systems was otherwise negative.  Outpatient Encounter Medications as of 12/22/2017  Medication Sig  . atorvastatin (LIPITOR) 40 MG tablet Take 1 tablet (40 mg total) by mouth every evening.  [name] BRILINTA 90 MG TABS tablet Take 1 tablet (90 mg total) by mouth 2 (two) times daily.  [name] escitalopram (LEXAPRO) 10 MG tablet Take 1 tablet (10 mg total) by mouth daily.  . metoprolol tartrate (LOPRESSOR) 25 MG tablet Take 1 tablet (25 mg total) by mouth 2 (two) times daily.  . Multiple Vitamin (MULTI-VITAMINS) TABS Take 1 tablet by mouth daily.  [name] lisinopril (PRINIVIL,ZESTRIL) 10 MG tablet Take 10 mg by mouth daily.  . Na Sulfate-K Sulfate-Mg Sulf 17.5-3.13-1.6 GM/177ML SOLN Take as directed for colonoscopy prep.  . nitroGLYCERIN (NITROSTAT) 0.4 MG SL tablet Place 0.4 mg  under the tongue every 5 (five) minutes x 3 doses as needed.  . [DISCONTINUED] sildenafil (REVATIO) 20 MG tablet TAKE 2-5 TABLETS BY MOUTH AS NEEDED SEXUAL ACTIVITY (Patient not taking: Reported on 11/24/2016)  . [DISCONTINUED] traZODone (DESYREL) 50 MG tablet Take 50 mg by mouth at bedtime as needed.   No facility-administered encounter medications on file as of 12/22/2017.    No Known Allergies Patient Active Problem List   Diagnosis Date Noted  . PCP NOTES >>>>>>>>>>>> 11/29/2016  . CAD (coronary artery disease) 11/29/2016  . High risk sexual behavior 11/23/2015  . Anxiety state 06/17/2015  . Potential exposure to STD 12/16/2014  . Screening, ischemic heart disease 12/10/2014  . Plantar fasciitis of left foot 12/10/2014  . Colon cancer screening 05/21/2014  . Erectile dysfunction 08/24/2013  . Other malaise and fatigue 08/24/2013  . Essential hypertension, benign 07/20/2013  . Screening for prostate cancer 07/20/2013  . Routine general medical examination at a health care facility 07/20/2013   Social History   Socioeconomic History  . Marital status: Divorced    Spouse name: Not on file  . Number of children: Not on file  . Years of education: Not on file  . Highest education level: Not on file  Occupational History  . 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 Financial resource strain: Not on file  . Food insecurity:    Worry: Not on file    Inability: Not on file  .  Transportation needs:    Medical: Not on file    Non-medical: Not on file  Tobacco Use  . Smoking status: Never Smoker  . Smokeless tobacco: Never Used  Substance and Sexual Activity  . Alcohol use: Yes    Alcohol/week: 1.8 oz    Types: 3 Cans of beer per week    Comment: Ocasionally   . Drug use: No  . Sexual activity: Not Currently  Lifestyle  . Physical activity:    Days per week: Not on file    Minutes per session: Not on file  . Stress: Not on file    Relationships  . Social connections:    Talks on phone: Not on file    Gets together: Not on file    Attends religious service: Not on file    Active member of club or organization: Not on file    Attends meetings of clubs or organizations: Not on file    Relationship status: Not on file  . Intimate partner violence:    Fear of current or ex partner: Not on file    Emotionally abused: Not on file    Physically abused: Not on file    Forced sexual activity: Not on file  Other Topics Concern  . 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      Objective:    Vitals:   12/22/17 1442  BP: 108/60  Pulse: (!) 52    Physical Exam; well-developed white male in no acute distress, pleasant blood pressure 108/60 pulse 52, height 6 foot 1, weight 193, BMI 25.5. HEENT; nontraumatic normocephalic EOMI PERRLA sclera anicteric,, Neck supple Cardiovascular ;regular rate and rhythm with S1-S2, Pulmonary; clear bilaterally, Abdomen ;soft nontender nondistended bowel sounds are active there is no palpable mass or hepatosplenomegaly, Rectal ;exam not done, Extremities; no clubbing cyanosis or edema skin warm and dry, Neuropsych ;mood and affect appropriate       Assessment & Plan:   #73 57 year old white male with history of  5 tubular adenomatous polyps found at colonoscopy January 2016 due for 3 year interval follow-up #2 chronic anticoagulation-on Brilinta #3 coronary artery disease status post MI March 2018 with placement of drug-eluting stents to the LAD and circumflex #4 hypertension  Plan; Patient will be scheduled for colonoscopy with Dr. Hilarie Fredrickson. Procedure was discussed in detail with the patient including indications risks and benefits and he is agreeable to proceed Patient will need to hold Brilinta for 5 days prior to the procedure. He has been over 1 year since stent placement. We will communicate with his cardiologist  Dr. Jeneen Rinks McGukin  to assure that holding Brilinta and continuing baby aspirin is reasonable for this patient.  Amy S Esterwood PA-C 12/22/2017   Cc: Colon Branch, MD   Addendum: Reviewed and agree with  management. Pyrtle, Lajuan Lines, MD

## 2018-01-02 DIAGNOSIS — D225 Melanocytic nevi of trunk: Secondary | ICD-10-CM | POA: Diagnosis not present

## 2018-01-02 DIAGNOSIS — Z08 Encounter for follow-up examination after completed treatment for malignant neoplasm: Secondary | ICD-10-CM | POA: Diagnosis not present

## 2018-01-02 DIAGNOSIS — Z85828 Personal history of other malignant neoplasm of skin: Secondary | ICD-10-CM | POA: Diagnosis not present

## 2018-01-09 ENCOUNTER — Telehealth: Payer: Self-pay | Admitting: *Deleted

## 2018-01-09 NOTE — Telephone Encounter (Signed)
LM for the patient on the cell phone voice mail. Advised per the cardiologist, he can hold the Brilinta on 5-31 and stay off it through 01-24-2018. Dr. Hilarie Fredrickson will tell him to resume it after the colonoscopy. Advised if he has any questions he can call me here at [phone], choose option 2 and ask for Whitman Meinhardt P.

## 2018-01-24 ENCOUNTER — Encounter: Payer: Self-pay | Admitting: Internal Medicine

## 2018-01-24 ENCOUNTER — Other Ambulatory Visit: Payer: Self-pay

## 2018-01-24 ENCOUNTER — Ambulatory Visit (AMBULATORY_SURGERY_CENTER): Payer: 59 | Admitting: Internal Medicine

## 2018-01-24 VITALS — BP 109/66 | HR 50 | Temp 97.5°F | Resp 19 | Ht 73.0 in | Wt 193.0 lb

## 2018-01-24 DIAGNOSIS — D125 Benign neoplasm of sigmoid colon: Secondary | ICD-10-CM | POA: Diagnosis not present

## 2018-01-24 DIAGNOSIS — Z86010 Personal history of colonic polyps: Secondary | ICD-10-CM | POA: Diagnosis not present

## 2018-01-24 DIAGNOSIS — K635 Polyp of colon: Secondary | ICD-10-CM

## 2018-01-24 DIAGNOSIS — D123 Benign neoplasm of transverse colon: Secondary | ICD-10-CM | POA: Diagnosis not present

## 2018-01-24 MED ORDER — SODIUM CHLORIDE 0.9 % IV SOLN: 500.0000 mL | Freq: Once | INTRAVENOUS | Status: DC

## 2018-01-24 NOTE — Op Note (Signed)
[facility] Patient Name: Maurice Hernandez Procedure Date: 01/24/2018 3:01 PM MRN: [id number] Endoscopist: Jerene Bears , MD Age: 57 Referring MD:  Date of Birth: 1961/06/07 Gender: Male Account #: [id number] Procedure:                Colonoscopy Indications:              Surveillance: Personal history of adenomatous                            polyps on last colonoscopy 3 years ago Procedure:                Pre-Anesthesia Assessment:                           - Prior to the procedure, a History and Physical                            was performed, and patient medications and                            allergies were reviewed. The patient's tolerance of                            previous anesthesia was also reviewed. The risks                            and benefits of the procedure and the sedation                            options and risks were discussed with the patient.                            All questions were answered, and informed consent                            was obtained. Prior Anticoagulants: The patient has                            taken Brilinta, last dose was 5 days prior to                            procedure. ASA Grade Assessment: II - A patient                            with mild systemic disease. After reviewing the                            risks and benefits, the patient was deemed in                            satisfactory condition to undergo the procedure.                           After obtaining informed consent, the  colonoscope                            was passed under direct vision. Throughout the                            procedure, the patient's blood pressure, pulse, and                            oxygen saturations were monitored continuously. The                            Colonoscope was introduced through the anus and                            advanced to the cecum, identified by appendiceal                            orifice  and ileocecal valve. The colonoscopy was                            performed without difficulty. The patient tolerated                            the procedure well. The quality of the bowel                            preparation was good. The ileocecal valve,                            appendiceal orifice, and rectum were photographed. Scope In: 3:12:57 PM Scope Out: 3:25:05 PM Scope Withdrawal Time: 0 hours 8 minutes 1 second  Total Procedure Duration: 0 hours 12 minutes 8 seconds  Findings:                 The perianal and digital rectal examinations were                            normal.                           A 2 mm polyp was found in the transverse colon. The                            polyp was sessile. The polyp was removed with a                            cold snare. Resection and retrieval were complete.                           A 5 mm polyp was found in the sigmoid colon. The                            polyp was sessile. The polyp was removed with a  cold snare. Resection and retrieval were complete.                           The exam was otherwise without abnormality on                            direct and retroflexion views. Complications:            No immediate complications. Estimated Blood Loss:     Estimated blood loss was minimal. Impression:               - One 2 mm polyp in the transverse colon, removed                            with a cold snare. Resected and retrieved.                           - One 5 mm polyp in the sigmoid colon, removed with                            a cold snare. Resected and retrieved.                           - The examination was otherwise normal on direct                            and retroflexion views. Recommendation:           - Patient has a contact number available for                            emergencies. The signs and symptoms of potential                            delayed complications were  discussed with the                            patient. Return to normal activities tomorrow.                            Written discharge instructions were provided to the                            patient.                           - Resume previous diet.                           - Continue present medications.                           - Resume Brilinta at prior dose today. Refer to                            managing physician for further adjustment  of                            therapy.                           - Await pathology results.                           - Repeat colonoscopy in 5 years for surveillance. Jerene Bears, MD 01/24/2018 3:29:11 PM This report has been signed electronically.

## 2018-01-24 NOTE — Progress Notes (Signed)
Pt's states no medical or surgical changes since previsit or office visit. 

## 2018-01-24 NOTE — Progress Notes (Signed)
Called to room to assist during endoscopic procedure.  Patient ID and intended procedure confirmed with present staff. Received instructions for my participation in the procedure from the performing physician.  

## 2018-01-24 NOTE — Progress Notes (Signed)
Report to PACU, RN, vss, BBS= Clear.  

## 2018-01-24 NOTE — Patient Instructions (Signed)
YOU HAD AN ENDOSCOPIC PROCEDURE TODAY AT [facility]:   Refer to the procedure report that was given to you for any specific questions about what was found during the examination.  If the procedure report does not answer your questions, please call your gastroenterologist to clarify.  If you requested that your care partner not be given the details of your procedure findings, then the procedure report has been included in a sealed envelope for you to review at your convenience later.  YOU SHOULD EXPECT: Some feelings of bloating in the abdomen. Passage of more gas than usual.  Walking can help get rid of the air that was put into your GI tract during the procedure and reduce the bloating. If you had a lower endoscopy (such as a colonoscopy or flexible sigmoidoscopy) you may notice spotting of blood in your stool or on the toilet paper. If you underwent a bowel prep for your procedure, you may not have a normal bowel movement for a few days.  Please Note:  You might notice some irritation and congestion in your nose or some drainage.  This is from the oxygen used during your procedure.  There is no need for concern and it should clear up in a day or so.  SYMPTOMS TO REPORT IMMEDIATELY:   Following lower endoscopy (colonoscopy or flexible sigmoidoscopy):  Excessive amounts of blood in the stool  Significant tenderness or worsening of abdominal pains  Swelling of the abdomen that is new, acute  Fever of 100F or higher    For urgent or emergent issues, a gastroenterologist can be reached at any hour by calling [phone].   DIET:  We do recommend a small meal at first, but then you may proceed to your regular diet.  Drink plenty of fluids but you should avoid alcoholic beverages for 24 hours.  ACTIVITY:  You should plan to take it easy for the rest of today and you should NOT DRIVE or use heavy machinery until tomorrow (because of the sedation medicines used during the test).     FOLLOW UP: Our staff will call the number listed on your records the next business day following your procedure to check on you and address any questions or concerns that you may have regarding the information given to you following your procedure. If we do not reach you, we will leave a message.  However, if you are feeling well and you are not experiencing any problems, there is no need to return our call.  We will assume that you have returned to your regular daily activities without incident.  If any biopsies were taken you will be contacted by phone or by letter within the next 1-3 weeks.  Please call us at [phone] if you have not heard about the biopsies in 3 weeks.    SIGNATURES/CONFIDENTIALITY: You and/or your care partner have signed paperwork which will be entered into your electronic medical record.  These signatures attest to the fact that that the information above on your After Visit Summary has been reviewed and is understood.  Full responsibility of the confidentiality of this discharge information lies with you and/or your care-partner.    Handouts were given to your care partner on polyps. Per Dr. Kary Kos today at prior dose TODAY. You may resume your other current medications today. Await biopsy results. Please call if any questions or concerns.

## 2018-01-24 NOTE — Progress Notes (Signed)
No problems noted in the recovery room. maw 

## 2018-01-25 ENCOUNTER — Telehealth: Payer: Self-pay

## 2018-01-25 NOTE — Telephone Encounter (Signed)
  Follow up Call-  Call back number 01/24/2018  Post procedure Call Back phone  # [phone]  Permission to leave phone message Yes  Some recent data might be hidden     Patient questions:  Do you have a fever, pain , or abdominal swelling? No. Pain Score  0 *  Have you tolerated food without any problems? Yes.    Have you been able to return to your normal activities? Yes.    Do you have any questions about your discharge instructions: Diet   No. Medications  No. Follow up visit  No.  Do you have questions or concerns about your Care? No.  Actions: * If pain score is 4 or above: No action needed, pain <4.

## 2018-01-29 ENCOUNTER — Encounter: Payer: Self-pay | Admitting: Internal Medicine

## 2018-02-02 ENCOUNTER — Other Ambulatory Visit: Payer: Self-pay | Admitting: Internal Medicine

## 2018-02-11 ENCOUNTER — Other Ambulatory Visit: Payer: Self-pay | Admitting: Internal Medicine

## 2018-02-13 ENCOUNTER — Ambulatory Visit: Payer: BLUE CROSS/BLUE SHIELD | Admitting: Internal Medicine

## 2018-02-19 ENCOUNTER — Encounter: Payer: Self-pay | Admitting: Family Medicine

## 2018-02-19 ENCOUNTER — Ambulatory Visit: Payer: 59 | Admitting: Family Medicine

## 2018-02-19 VITALS — BP 122/80 | HR 51 | Ht 73.0 in | Wt 194.1 lb

## 2018-02-19 DIAGNOSIS — F411 Generalized anxiety disorder: Secondary | ICD-10-CM

## 2018-02-19 MED ORDER — ESCITALOPRAM OXALATE 10 MG PO TABS: ORAL_TABLET | ORAL | 0 refills | Status: DC
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
## 2018-03-23 ENCOUNTER — Other Ambulatory Visit: Payer: Self-pay | Admitting: Family Medicine

## 2018-03-23 DIAGNOSIS — F411 Generalized anxiety disorder: Secondary | ICD-10-CM

## 2018-03-23 NOTE — Telephone Encounter (Signed)
Patient was weaned off this medication per last OV with [facility]//if pt needs then must call the office/thx dmf

## 2018-06-28 ENCOUNTER — Ambulatory Visit (INDEPENDENT_AMBULATORY_CARE_PROVIDER_SITE_OTHER): Payer: 59 | Admitting: Family Medicine

## 2018-06-28 ENCOUNTER — Encounter: Payer: Self-pay | Admitting: Family Medicine

## 2018-06-28 VITALS — BP 128/80 | HR 56 | Temp 97.7°F | Ht 73.0 in | Wt 187.0 lb

## 2018-06-28 DIAGNOSIS — I10 Essential (primary) hypertension: Secondary | ICD-10-CM | POA: Diagnosis not present

## 2018-06-28 DIAGNOSIS — Z23 Encounter for immunization: Secondary | ICD-10-CM | POA: Diagnosis not present

## 2018-06-28 DIAGNOSIS — Z0001 Encounter for general adult medical examination with abnormal findings: Secondary | ICD-10-CM

## 2018-06-28 DIAGNOSIS — N5201 Erectile dysfunction due to arterial insufficiency: Secondary | ICD-10-CM

## 2018-06-28 DIAGNOSIS — Z0000 Encounter for general adult medical examination without abnormal findings: Secondary | ICD-10-CM | POA: Diagnosis not present

## 2018-06-28 DIAGNOSIS — I2510 Atherosclerotic heart disease of native coronary artery without angina pectoris: Secondary | ICD-10-CM

## 2018-06-28 DIAGNOSIS — Z125 Encounter for screening for malignant neoplasm of prostate: Secondary | ICD-10-CM

## 2018-06-28 DIAGNOSIS — D229 Melanocytic nevi, unspecified: Secondary | ICD-10-CM | POA: Insufficient documentation

## 2018-06-28 DIAGNOSIS — Z202 Contact with and (suspected) exposure to infections with a predominantly sexual mode of transmission: Secondary | ICD-10-CM

## 2018-06-28 LAB — CBC
HEMATOCRIT: 44.1 % (ref 39.0–52.0)
Hemoglobin: 15.2 g/dL (ref 13.0–17.0)
MCHC: 34.5 g/dL (ref 30.0–36.0)
MCV: 92.7 fl (ref 78.0–100.0)
Platelets: 170 10*3/uL (ref 150.0–400.0)
RBC: 4.76 Mil/uL (ref 4.22–5.81)
RDW: 13.1 % (ref 11.5–15.5)
WBC: 4.2 10*3/uL (ref 4.0–10.5)

## 2018-06-28 LAB — COMPREHENSIVE METABOLIC PANEL
ALBUMIN: 4.6 g/dL (ref 3.5–5.2)
ALT: 35 U/L (ref 0–53)
AST: 30 U/L (ref 0–37)
Alkaline Phosphatase: 61 U/L (ref 39–117)
BUN: 13 mg/dL (ref 6–23)
CHLORIDE: 103 meq/L (ref 96–112)
CO2: 30 meq/L (ref 19–32)
CREATININE: 1.05 mg/dL (ref 0.40–1.50)
Calcium: 9.5 mg/dL (ref 8.4–10.5)
GFR: 77.89 mL/min (ref >60.00)
Glucose, Bld: 97 mg/dL (ref 70–99)
POTASSIUM: 3.7 meq/L (ref 3.5–5.1)
SODIUM: 139 meq/L (ref 135–145)
Total Bilirubin: 1.1 mg/dL (ref 0.2–1.2)
Total Protein: 6.9 g/dL (ref 6.0–8.3)

## 2018-06-28 LAB — URINALYSIS, ROUTINE W REFLEX MICROSCOPIC
Bilirubin Urine: NEGATIVE
Hgb urine dipstick: NEGATIVE
Ketones, ur: NEGATIVE
Leukocytes, UA: NEGATIVE
NITRITE: NEGATIVE
RBC / HPF: NONE SEEN (ref 0–?)
Specific Gravity, Urine: 1.025 (ref 1.000–1.030)
TOTAL PROTEIN, URINE-UPE24: NEGATIVE
URINE GLUCOSE: NEGATIVE
Urobilinogen, UA: 0.2 (ref 0.0–1.0)
pH: 5.5 (ref 5.0–8.0)

## 2018-06-28 LAB — TSH: TSH: 1.91 u[IU]/mL (ref 0.35–4.50)

## 2018-06-28 LAB — LIPID PANEL
CHOL/HDL RATIO: 3
CHOLESTEROL: 127 mg/dL (ref 0–200)
HDL: 42 mg/dL (ref >39.00)
LDL CALC: 62 mg/dL (ref 0–99)
NonHDL: 85.04
Triglycerides: 117 mg/dL (ref 0.0–149.0)
VLDL: 23.4 mg/dL (ref 0.0–40.0)

## 2018-06-28 LAB — PSA: PSA: 0.43 ng/mL (ref 0.10–4.00)

## 2018-06-28 MED ORDER — SILDENAFIL CITRATE 20 MG PO TABS: ORAL_TABLET | ORAL | 1 refills | Status: DC
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
## 2018-06-28 NOTE — Patient Instructions (Signed)
Heart-Healthy Eating Plan Many factors influence your heart health, including eating and exercise habits. Heart (coronary) risk increases with abnormal blood fat (lipid) levels. Heart-healthy meal planning includes limiting unhealthy fats, increasing healthy fats, and making other small dietary changes. This includes maintaining a healthy body weight to help keep lipid levels within a normal range. What is my plan? Your health care provider recommends that you:  Get no more than _________% of the total calories in your daily diet from fat.  Limit your intake of saturated fat to less than _________% of your total calories each day.  Limit the amount of cholesterol in your diet to less than _________ mg per day.  What types of fat should I choose?  Choose healthy fats more often. Choose monounsaturated and polyunsaturated fats, such as olive oil and canola oil, flaxseeds, walnuts, almonds, and seeds.  Eat more omega-3 fats. Good choices include salmon, mackerel, sardines, tuna, flaxseed oil, and ground flaxseeds. Aim to eat fish at least two times each week.  Limit saturated fats. Saturated fats are primarily found in animal products, such as meats, butter, and cream. Plant sources of saturated fats include palm oil, palm kernel oil, and coconut oil.  Avoid foods with partially hydrogenated oils in them. These contain trans fats. Examples of foods that contain trans fats are stick margarine, some tub margarines, cookies, crackers, and other baked goods. What general guidelines do I need to follow?  Check food labels carefully to identify foods with trans fats or high amounts of saturated fat.  Fill one half of your plate with vegetables and green salads. Eat 4-5 servings of vegetables per day. A serving of vegetables equals 1 cup of raw leafy vegetables,  cup of raw or cooked cut-up vegetables, or  cup of vegetable juice.  Fill one fourth of your plate with whole grains. Look for the word  "whole" as the first word in the ingredient list.  Fill one fourth of your plate with lean protein foods.  Eat 4-5 servings of fruit per day. A serving of fruit equals one medium whole fruit,  cup of dried fruit,  cup of fresh, frozen, or canned fruit, or  cup of 100% fruit juice.  Eat more foods that contain soluble fiber. Examples of foods that contain this type of fiber are apples, broccoli, carrots, beans, peas, and barley. Aim to get 20-30 g of fiber per day.  Eat more home-cooked food and less restaurant, buffet, and fast food.  Limit or avoid alcohol.  Limit foods that are high in starch and sugar.  Avoid fried foods.  Cook foods by using methods other than frying. Baking, boiling, grilling, and broiling are all great options. Other fat-reducing suggestions include: ? Removing the skin from poultry. ? Removing all visible fats from meats. ? Skimming the fat off of stews, soups, and gravies before serving them. ? Steaming vegetables in water or broth.  Lose weight if you are overweight. Losing just 5-10% of your initial body weight can help your overall health and prevent diseases such as diabetes and heart disease.  Increase your consumption of nuts, legumes, and seeds to 4-5 servings per week. One serving of dried beans or legumes equals  cup after being cooked, one serving of nuts equals 1 ounces, and one serving of seeds equals  ounce or 1 tablespoon.  You may need to monitor your salt (sodium) intake, especially if you have high blood pressure. Talk with your health care provider or dietitian to get  more information about reducing sodium. What foods can I eat? Grains  Breads, including [place], white, pita, wheat, raisin, rye, oatmeal, and [place]. Tortillas that are neither fried nor made with lard or trans fat. Low-fat rolls, including hotdog and hamburger buns and English muffins. Biscuits. Muffins. Waffles. Pancakes. Light popcorn. Whole-grain cereals. Flatbread.  Melba toast. Pretzels. Breadsticks. Rusks. Low-fat snacks and crackers, including oyster, saltine, matzo, graham, animal, and rye. Rice and pasta, including brown rice and those that are made with whole wheat. Vegetables All vegetables. Fruits All fruits, but limit coconut. Meats and Other Protein Sources Lean, well-trimmed beef, veal, pork, and lamb. Chicken and [place] without skin. All fish and shellfish. Wild duck, rabbit, pheasant, and venison. Egg whites or low-cholesterol egg substitutes. Dried beans, peas, lentils, and tofu.Seeds and most nuts. Dairy Low-fat or nonfat cheeses, including ricotta, string, and mozzarella. Skim or 1% milk that is liquid, powdered, or evaporated. Buttermilk that is made with low-fat milk. Nonfat or low-fat yogurt. Beverages Mineral water. Diet carbonated beverages. Sweets and Desserts Sherbets and fruit ices. Honey, jam, marmalade, jelly, and syrups. Meringues and gelatins. Pure sugar candy, such as hard candy, jelly beans, gumdrops, mints, marshmallows, and small amounts of dark chocolate. [employer] Eat all sweets and desserts in moderation. Fats and Oils Nonhydrogenated (trans-free) margarines. Vegetable oils, including soybean, sesame, sunflower, olive, peanut, safflower, corn, canola, and cottonseed. Salad dressings or mayonnaise that are made with a vegetable oil. Limit added fats and oils that you use for cooking, baking, salads, and as spreads. Other Cocoa powder. Coffee and tea. All seasonings and condiments. The items listed above may not be a complete list of recommended foods or beverages. Contact your dietitian for more options. What foods are not recommended? Grains Breads that are made with saturated or trans fats, oils, or whole milk. Croissants. Butter rolls. Cheese breads. Sweet rolls. Donuts. Buttered popcorn. Chow mein noodles. High-fat crackers, such as cheese or butter crackers. Meats and Other Protein Sources Fatty meats, such  as hotdogs, short ribs, sausage, spareribs, bacon, ribeye roast or steak, and mutton. High-fat deli meats, such as salami and bologna. Caviar. Domestic duck and goose. Organ meats, such as kidney, liver, sweetbreads, brains, gizzard, chitterlings, and heart. Dairy Cream, sour cream, cream cheese, and creamed cottage cheese. Whole milk cheeses, including blue (bleu), Monte[place], [place], [place], [place], [place], [place], [place], [place], and [place]. Whole or 2% milk that is liquid, evaporated, or condensed. Whole buttermilk. Cream sauce or high-fat cheese sauce. Yogurt that is made from whole milk. Beverages Regular sodas and drinks with added sugar. Sweets and Desserts Frosting. Pudding. Cookies. Cakes other than angel food cake. Candy that has milk chocolate or white chocolate, hydrogenated fat, butter, coconut, or unknown ingredients. Buttered syrups. Full-fat ice cream or ice cream drinks. Fats and Oils Gravy that has suet, meat fat, or shortening. Cocoa butter, hydrogenated oils, palm oil, coconut oil, palm kernel oil. These can often be found in baked products, candy, fried foods, nondairy creamers, and whipped toppings. Solid fats and shortenings, including bacon fat, salt pork, lard, and butter. Nondairy cream substitutes, such as coffee creamers and sour cream substitutes. Salad dressings that are made of unknown oils, cheese, or sour cream. The items listed above may not be a complete list of foods and beverages to avoid. Contact your dietitian for more information. This information is not intended to replace advice given to you by your health care provider. Make sure you discuss any questions you have with your health care  provider. Document Released: 05/17/2008 Document Revised: 02/26/2016 Document Reviewed: 01/30/2014 Elsevier Interactive Patient Education  2018 [place] Years, Male Preventive care refers to lifestyle choices and visits with your health  care provider that can promote health and wellness. What does preventive care include?  A yearly physical exam. This is also called an annual well check.  Dental exams once or twice a year.  Routine eye exams. Ask your health care provider how often you should have your eyes checked.  Personal lifestyle choices, including: ? Daily care of your teeth and gums. ? Regular physical activity. ? Eating a healthy diet. ? Avoiding tobacco and drug use. ? Limiting alcohol use. ? Practicing safe sex. ? Taking low-dose aspirin every day starting at age 67. What happens during an annual well check? The services and screenings done by your health care provider during your annual well check will depend on your age, overall health, lifestyle risk factors, and family history of disease. Counseling Your health care provider may ask you questions about your:  Alcohol use.  Tobacco use.  Drug use.  Emotional well-being.  Home and relationship well-being.  Sexual activity.  Eating habits.  Work and work Statistician.  Screening You may have the following tests or measurements:  Height, weight, and BMI.  Blood pressure.  Lipid and cholesterol levels. These may be checked every 5 years, or more frequently if you are over 54 years old.  Skin check.  Lung cancer screening. You may have this screening every year starting at age 65 if you have a 30-pack-year history of smoking and currently smoke or have quit within the past 15 years.  Fecal occult blood test (FOBT) of the stool. You may have this test every year starting at age 57.  Flexible sigmoidoscopy or colonoscopy. You may have a sigmoidoscopy every 5 years or a colonoscopy every 10 years starting at age 30.  Prostate cancer screening. Recommendations will vary depending on your family history and other risks.  Hepatitis C blood test.  Hepatitis B blood test.  Sexually transmitted disease (STD) testing.  Diabetes screening.  This is done by checking your blood sugar (glucose) after you have not eaten for a while (fasting). You may have this done every 1-3 years.  Discuss your test results, treatment options, and if necessary, the need for more tests with your health care provider. Vaccines Your health care provider may recommend certain vaccines, such as:  Influenza vaccine. This is recommended every year.  Tetanus, diphtheria, and acellular pertussis (Tdap, Td) vaccine. You may need a Td booster every 10 years.  Varicella vaccine. You may need this if you have not been vaccinated.  Zoster vaccine. You may need this after age 26.  Measles, mumps, and rubella (MMR) vaccine. You may need at least one dose of MMR if you were born in 1957 or later. You may also need a second dose.  Pneumococcal 13-valent conjugate (PCV13) vaccine. You may need this if you have certain conditions and have not been vaccinated.  Pneumococcal polysaccharide (PPSV23) vaccine. You may need one or two doses if you smoke cigarettes or if you have certain conditions.  Meningococcal vaccine. You may need this if you have certain conditions.  Hepatitis A vaccine. You may need this if you have certain conditions or if you travel or work in places where you may be exposed to hepatitis A.  Hepatitis B vaccine. You may need this if you have certain conditions or if you  travel or work in places where you may be exposed to hepatitis B.  Haemophilus influenzae type b (Hib) vaccine. You may need this if you have certain risk factors.  Talk to your health care provider about which screenings and vaccines you need and how often you need them. This information is not intended to replace advice given to you by your health care provider. Make sure you discuss any questions you have with your health care provider. Document Released: 09/04/2015 Document Revised: 04/27/2016 Document Reviewed: 06/09/2015 Elsevier Interactive Patient Education  [employer].

## 2018-06-29 LAB — HEPATITIS C ANTIBODY
HEP C AB: NONREACTIVE
SIGNAL TO CUT-OFF: 0.01 (ref <1.00)

## 2018-06-29 LAB — HIV ANTIBODY (ROUTINE TESTING W REFLEX): HIV 1&2 Ab, 4th Generation: NONREACTIVE

## 2018-12-14 ENCOUNTER — Telehealth: Payer: Self-pay | Admitting: Family Medicine

## 2018-12-14 NOTE — Telephone Encounter (Signed)
Called to switch appointment to virtual visit, Had to leave a message

## 2018-12-27 ENCOUNTER — Ambulatory Visit: Payer: 59 | Admitting: Family Medicine

## 2019-01-07 DIAGNOSIS — Z08 Encounter for follow-up examination after completed treatment for malignant neoplasm: Secondary | ICD-10-CM | POA: Diagnosis not present

## 2019-01-07 DIAGNOSIS — L720 Epidermal cyst: Secondary | ICD-10-CM | POA: Diagnosis not present

## 2019-01-07 DIAGNOSIS — Z85828 Personal history of other malignant neoplasm of skin: Secondary | ICD-10-CM | POA: Diagnosis not present

## 2019-05-13 ENCOUNTER — Telehealth: Payer: Self-pay

## 2019-05-13 ENCOUNTER — Other Ambulatory Visit: Payer: Self-pay

## 2019-05-13 DIAGNOSIS — Z20822 Contact with and (suspected) exposure to covid-19: Secondary | ICD-10-CM

## 2019-05-13 NOTE — Telephone Encounter (Signed)
Okay for test. Overdue for follow up with me.

## 2019-05-13 NOTE — Telephone Encounter (Signed)
Spoke with pt. He is aware to go to the [facility] for testing.

## 2019-05-13 NOTE — Telephone Encounter (Signed)
Copied from [place] [id number]. Topic: General - Other >> May 13, 2019  9:11 AM Rainey [place] A wrote: Patients close coworker tested positive for covid. Patient isnt experiencing any symptoms. Patient would likea callback from nurse in regards to an order for covid test.

## 2019-05-16 ENCOUNTER — Ambulatory Visit: Payer: 59

## 2019-05-16 LAB — NOVEL CORONAVIRUS, NAA: SARS-CoV-2, NAA: NOT DETECTED

## 2019-07-09 ENCOUNTER — Ambulatory Visit: Payer: 59 | Admitting: Family Medicine

## 2019-07-09 ENCOUNTER — Other Ambulatory Visit: Payer: Self-pay

## 2019-07-09 ENCOUNTER — Encounter: Payer: Self-pay | Admitting: Family Medicine

## 2019-07-09 ENCOUNTER — Ambulatory Visit (INDEPENDENT_AMBULATORY_CARE_PROVIDER_SITE_OTHER): Payer: 59

## 2019-07-09 VITALS — BP 130/80 | Ht 73.0 in | Wt 196.0 lb

## 2019-07-09 DIAGNOSIS — S20211A Contusion of right front wall of thorax, initial encounter: Secondary | ICD-10-CM

## 2019-07-09 NOTE — Patient Instructions (Signed)
Blunt Chest Trauma Blunt chest trauma is an injury that is caused by a hard, direct hit (blow) to the chest. The blow can be strong enough to injure multiple body parts and organs. A blunt trauma does not involve a puncture of the skin. Blunt chest trauma often results in bruised or broken (fractured) ribs. In many cases, the soft tissue in the chest wall is also injured, and that damage causes pain and bruising. Internal organs, such as the heart and lungs, can be injured as well. Blunt chest trauma can lead to serious medical problems. This injury requires immediate medical care. What are the causes? This condition may be caused by:  Motor vehicle collisions.  Falls.  Physical violence.  Sports injuries. What are the signs or symptoms? Symptoms of this condition include:  Chest pain. The pain may be worse when you breathe deeply or move.  Shortness of breath.  Light-headedness.  Bruising.  Tenderness.  Swelling.  Drowsiness or confusion.  Cold and clammy skin.  Feeling as if your heart is racing. How is this diagnosed? This condition is diagnosed based on your medical history and a physical exam. You may also have imaging tests, including:  X-ray.  CT scan.  MRI.  Ultrasound. How is this treated? Treatment for this condition depends on what type of injury you have and how severe it is. You may need to stay in the hospital. Treatment may include:  Pain medicine. You may be given pain medicine through an IV at first. You may also be given over-the-counter and prescription pain medicines.  Tubes or other devices to help you breathe (like an incentive spirometer).  Inserting a tube into your chest to drain excess fluid, blood, or air.  Fluid or blood transfusions through an IV.  Surgery to repair broken ribs and fix damaged tissue and organs.  Lung (pulmonary) rehabilitation. This may include exercises, counseling, or support groups. Follow these instructions  at home: Activity   Do not lift anything that is heavier than 10 lb (4.5 kg), or the limit that you are told, until your health care provider says that it is safe.  Limit your activity as told by your health care provider. Ask your health care provider what activities are safe for you.  Do exercises as told by your health care provider. Medicines  Take over-the-counter and prescription medicines only as told by your health care provider.  Do not drive or use heavy machinery while taking prescription pain medicine.  If you are taking prescription pain medicine, take actions to prevent or treat constipation. Your health care provider may recommend that you: ? Drink enough fluid to keep your urine pale yellow. ? Eat foods that are high in fiber, such as fresh fruits and vegetables, whole grains, and beans. ? Limit foods that are high in fat and processed sugars, such as fried or sweet foods. ? Take an over-the-counter or prescription medicine for constipation. General instructions   If directed, apply ice to the injured area: ? Put ice in a plastic bag. ? Place a towel between your skin and the bag. ? Leave the ice on for 20 minutes, 2-3 times a day.  Do not use any products that contain nicotine or tobacco, such as cigarettes and e-cigarettes. These may delay healing after an injury. If you need help quitting, ask your health care provider.  Use your incentive spirometer as directed to help you take deep breaths.  Consider joining a support group. This may help you  learn about your condition and techniques to help with recovery.  Keep all follow-up visits as told by your health care provider. This is important. Follow-up visits may include counseling. Contact a health care provider if:  Your pain gets worse.  You develop a cough or wheezing. Get help right away if:  You have any of these: ? Shortness of breath. ? Pain in your abdomen. ? Nausea or vomiting. ? A fever or  chills. ? A rapid heart rate.  You cough up blood.  You feel dizzy or weak.  You faint.  You have severe chest pain. This may come with other symptoms, such as: ? Dizziness. ? Shortness of breath. ? Pain in your neck, jaw, or back, or in one arm or both arms. These symptoms may represent a serious problem that is an emergency. Do not wait to see if the symptoms will go away. Get medical help right away. Call your local emergency services (911 in the [place] Do not drive yourself to the hospital. Summary  Blunt chest trauma is an injury that is caused by a hard, direct hit (blow) to the chest. It may involve broken ribs, damage to the chest wall, and injury to internal organs such as the heart and lungs.  Blunt chest trauma can lead to serious medical problems. This injury requires immediate medical care.  Symptoms may include chest pain when moving or taking deep breaths, shortness of breath, bruising or swelling of the chest, faster heartbeat, light-headedness, or drowsiness.  Treatment for this condition depends on what type of injury you have and how severe it is.  Make sure you know which symptoms should cause you to get help right away. This information is not intended to replace advice given to you by your health care provider. Make sure you discuss any questions you have with your health care provider. Document Released: 09/15/2004 Document Revised: 07/21/2017 Document Reviewed: 06/28/2017 Elsevier Patient Education  2020 [employer].

## 2019-07-09 NOTE — Progress Notes (Signed)
Established Patient Office Visit  Subjective:  Patient ID: Maurice Hernandez, male    DOB: 08-08-1961  Age: 58 y.o. MRN: [id number]  CC:  Chief Complaint  Patient presents with   Fall    HPI GEO SPAGNOLO presents for evaluation of chest wall pain status post fall while running along the pectoral 3 days ago.  Patient is point tender along her rib on the right side of his chest.  There is no shortness of breath or difficulty taking a full breath.  He is afebrile.  Patient is requiring nothing for pain at this point.  Brilinta has been discontinued status post coronary artery stent placement 7 years ago.  He continues to do well post event.  Past Medical History:  Diagnosis Date   Bell's palsy 2003   CAD (coronary artery disease)    NSTEMI 11-15-16   Hypertension    Myocardial infarction ([facility]) 10/2016    Past Surgical History:  Procedure Laterality Date   INGUINAL HERNIA REPAIR Left 1968   INGUINAL HERNIA REPAIR Right 2001   WISDOM TOOTH EXTRACTION      Family History  Problem Relation Age of Onset   COPD Mother        Non Smoker (28)    Hypertension Mother    Heart disease Father        CABG (3-4 vessel)    Hypertension Sister    Dementia Sister    Cancer Sister        Hodgkin's Disease (1); Breast Cancer (Bilateral Mastectomy)    Hypertension Sister    Alzheimer's disease Paternal Aunt    Alzheimer's disease Paternal Uncle    Stomach cancer Maternal Aunt    Colon cancer Neg Hx    Esophageal cancer Neg Hx    Rectal cancer Neg Hx    Diabetes Neg Hx     Social History   Socioeconomic History   Marital status: Divorced    Spouse name: Not on file   Number of children: Not on file   Years of education: Not on file   Highest education level: Not on file  Occupational History   Occupation: Doctor, general practice: other - [employer]    Comment: [employer]  Social Designer, fashion/clothing strain: Not on file   Food insecurity    Worry: Not  on file    Inability: Not on file   Transportation needs    Medical: Not on file    Non-medical: Not on file  Tobacco Use   Smoking status: Never Smoker   Smokeless tobacco: Never Used  Substance and Sexual Activity   Alcohol use: Yes    Alcohol/week: 3.0 standard drinks    Types: 3 Cans of beer per week    Comment: Ocasionally    Drug use: No   Sexual activity: Not Currently  Lifestyle   Physical activity    Days per week: Not on file    Minutes per session: Not on file   Stress: Not on file  Relationships   Social connections    Talks on phone: Not on file    Gets together: Not on file    Attends religious service: Not on file    Active member of club or organization: Not on file    Attends meetings of clubs or organizations: Not on file    Relationship status: Not on file   Intimate partner violence    Fear of current or ex  partner: Not on file    Emotionally abused: Not on file    Physically abused: Not on file    Forced sexual activity: Not on file  Other Topics Concern   Not on file  Social History Narrative   Marital Status:  Divorced (x 3)    Children:  Son (1)    Pets: Dog (1)    Living Situation: Lives with son Agricultural consultant Custody)     Occupation:  Engineer, 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     atorvastatin (LIPITOR) 40 MG tablet Take 40 mg by mouth daily.     BRILINTA 90 MG TABS tablet Take 1 tablet (90 mg total) by mouth 2 (two) times daily. 180 tablet 0   escitalopram (LEXAPRO) 10 MG tablet Take one every other day for 2 weeks, then every 3rd day for 2 weeks and every 4th day for 2 weeks and stop. 30  tablet 0   metoprolol tartrate (LOPRESSOR) 25 MG tablet Take 1 tablet (25 mg total) by mouth 2 (two) times daily. 180 tablet 0   Multiple Vitamin (MULTI-VITAMINS) TABS Take 1 tablet by mouth daily.     sildenafil (REVATIO) 20 MG tablet Take one to three tablets daily 45 minutes prior to intercourse as needed 50 tablet 1   lisinopril (PRINIVIL,ZESTRIL) 10 MG tablet Take 10 mg by mouth daily.     No facility-administered medications prior to visit.     No Known Allergies  ROS Review of Systems  Constitutional: Negative.  Negative for diaphoresis.  HENT: Negative.   Respiratory: Negative for chest tightness, shortness of breath and wheezing.   Cardiovascular: Positive for chest pain. Negative for palpitations and leg swelling.  Gastrointestinal: Negative.  Negative for abdominal pain, diarrhea and vomiting.  Musculoskeletal: Negative for gait problem and joint swelling.  Skin: Negative for color change and pallor.  Neurological: Negative for light-headedness.  Hematological: Negative.   Psychiatric/Behavioral: Negative.       Objective:    Physical Exam  Constitutional: He is oriented to person, place, and time. He appears well-developed and well-nourished. No distress.  HENT:  Head: Normocephalic and atraumatic.  Right Ear: External ear normal.  Left Ear: External ear normal.  Eyes: Conjunctivae are normal. Right eye exhibits no discharge. Left eye exhibits no discharge. No scleral icterus.  Cardiovascular: Normal rate, regular rhythm and normal heart sounds.  Pulmonary/Chest: Effort normal and breath sounds normal.    Musculoskeletal:        General: No edema.  Neurological: He is alert and oriented to person, place, and time.  Skin: Skin is warm and dry. He is not diaphoretic.  Psychiatric: He has a normal mood and affect. His behavior is normal.    BP 130/80    Ht 6\' 1"  (1.854 m)    Wt 196 lb (88.9 kg)    SpO2 98%    BMI 25.86 kg/m  Wt Readings from Last 3  Encounters:  07/09/19 196  lb (88.9 kg)  06/28/18 187 lb (84.8 kg)  02/19/18 194 lb 2 oz (88.1 kg)   BP Readings from Last 3 Encounters:  07/09/19 130/80  06/28/18 128/80  02/19/18 122/80   Guideline developer:  UpToDate (see UpToDate for funding source) Date Released: June 2014  There are no preventive care reminders to display for this patient.  There are no preventive care reminders to display for this patient.  Lab Results  Component Value Date   TSH 1.91 06/28/2018   Lab Results  Component Value Date   WBC 4.2 06/28/2018   HGB 15.2 06/28/2018   HCT 44.1 06/28/2018   MCV 92.7 06/28/2018   PLT 170.0 06/28/2018   Lab Results  Component Value Date   NA 139 06/28/2018   K 3.7 06/28/2018   CO2 30 06/28/2018   GLUCOSE 97 06/28/2018   BUN 13 06/28/2018   CREATININE 1.05 06/28/2018   BILITOT 1.1 06/28/2018   ALKPHOS 61 06/28/2018   AST 30 06/28/2018   ALT 35 06/28/2018   PROT 6.9 06/28/2018   ALBUMIN 4.6 06/28/2018   CALCIUM 9.5 06/28/2018   GFR 77.89 06/28/2018   Lab Results  Component Value Date   CHOL 127 06/28/2018   Lab Results  Component Value Date   HDL 42.00 06/28/2018   Lab Results  Component Value Date   LDLCALC 62 06/28/2018   Lab Results  Component Value Date   TRIG 117.0 06/28/2018   Lab Results  Component Value Date   CHOLHDL 3 06/28/2018   Lab Results  Component Value Date   HGBA1C 5.7 11/24/2016      Assessment & Plan:   Problem List Items Addressed This Visit      Other   Chest wall contusion, right, initial encounter - Primary   Relevant Orders   DG Ribs Unilateral Right      No orders of the defined types were placed in this encounter.   Follow-up: Return if symptoms worsen or fail to improve.   Suggested rib binder for comfort sake.  Tylenol or Advil as needed.  Follow-up as needed.

## 2019-11-08 ENCOUNTER — Ambulatory Visit: Payer: Self-pay | Attending: Internal Medicine

## 2019-11-08 DIAGNOSIS — Z23 Encounter for immunization: Secondary | ICD-10-CM

## 2019-11-08 NOTE — Progress Notes (Signed)
   Covid-19 Vaccination Clinic  Name:  Maurice Hernandez    MRN: [id number] DOB: [birth year]  11/08/2019  Mr. Bronaugh was observed post Covid-19 immunization for 15 minutes without incident. He was provided with Vaccine Information Sheet and instruction to access the V-Safe system.   Mr. Doub was instructed to call 911 with any severe reactions post vaccine: [name] Difficulty breathing  . Swelling of face and throat  . A fast heartbeat  . A bad rash all over body  . Dizziness and weakness   Immunizations Administered    Name Date Dose VIS Date Route   Pfizer COVID-19 Vaccine 11/08/2019  8:58 AM 0.3 mL 08/02/2019 Intramuscular   Manufacturer: [place]   Lot: [id number]   [place]: [id number]

## 2019-11-29 ENCOUNTER — Encounter: Payer: 59 | Admitting: Family Medicine

## 2019-12-03 ENCOUNTER — Ambulatory Visit: Payer: Self-pay

## 2019-12-09 ENCOUNTER — Other Ambulatory Visit: Payer: Self-pay

## 2019-12-10 ENCOUNTER — Ambulatory Visit (INDEPENDENT_AMBULATORY_CARE_PROVIDER_SITE_OTHER): Payer: BC Managed Care – PPO | Admitting: Family Medicine

## 2019-12-10 ENCOUNTER — Encounter: Payer: Self-pay | Admitting: Family Medicine

## 2019-12-10 ENCOUNTER — Other Ambulatory Visit (HOSPITAL_COMMUNITY)
Admission: RE | Admit: 2019-12-10 | Discharge: 2019-12-10 | Disposition: A | Discharge status: Home / Self Care | Payer: BC Managed Care – PPO | Source: Ambulatory Visit | Attending: Family Medicine | Admitting: Family Medicine

## 2019-12-10 VITALS — BP 108/64 | HR 57 | Temp 97.0°F | Ht 73.0 in | Wt 187.0 lb

## 2019-12-10 DIAGNOSIS — N5201 Erectile dysfunction due to arterial insufficiency: Secondary | ICD-10-CM | POA: Diagnosis not present

## 2019-12-10 DIAGNOSIS — Z0000 Encounter for general adult medical examination without abnormal findings: Secondary | ICD-10-CM | POA: Diagnosis not present

## 2019-12-10 DIAGNOSIS — R351 Nocturia: Secondary | ICD-10-CM | POA: Insufficient documentation

## 2019-12-10 DIAGNOSIS — I2510 Atherosclerotic heart disease of native coronary artery without angina pectoris: Secondary | ICD-10-CM | POA: Diagnosis not present

## 2019-12-10 LAB — COMPREHENSIVE METABOLIC PANEL
ALT: 30 U/L (ref 0–53)
AST: 31 U/L (ref 0–37)
Albumin: 4.6 g/dL (ref 3.5–5.2)
Alkaline Phosphatase: 69 U/L (ref 39–117)
BUN: 18 mg/dL (ref 6–23)
CO2: 31 mEq/L (ref 19–32)
Calcium: 9.5 mg/dL (ref 8.4–10.5)
Chloride: 101 mEq/L (ref 96–112)
Creatinine, Ser: 1.07 mg/dL (ref 0.40–1.50)
GFR: 71.33 mL/min (ref >60.00)
Glucose, Bld: 90 mg/dL (ref 70–99)
Potassium: 4.1 mEq/L (ref 3.5–5.1)
Sodium: 137 mEq/L (ref 135–145)
Total Bilirubin: 1.1 mg/dL (ref 0.2–1.2)
Total Protein: 6.7 g/dL (ref 6.0–8.3)

## 2019-12-10 LAB — URINALYSIS, ROUTINE W REFLEX MICROSCOPIC
Bilirubin Urine: NEGATIVE
Hgb urine dipstick: NEGATIVE
Ketones, ur: NEGATIVE
Leukocytes,Ua: NEGATIVE
Nitrite: NEGATIVE
RBC / HPF: NONE SEEN (ref 0–?)
Specific Gravity, Urine: 1.025 (ref 1.000–1.030)
Total Protein, Urine: NEGATIVE
Urine Glucose: NEGATIVE
Urobilinogen, UA: 0.2 (ref 0.0–1.0)
pH: 5.5 (ref 5.0–8.0)

## 2019-12-10 LAB — PSA: PSA: 0.48 ng/mL (ref 0.10–4.00)

## 2019-12-10 LAB — CBC
HCT: 41.9 % (ref 39.0–52.0)
Hemoglobin: 14.3 g/dL (ref 13.0–17.0)
MCHC: 34 g/dL (ref 30.0–36.0)
MCV: 93.2 fl (ref 78.0–100.0)
Platelets: 187 10*3/uL (ref 150.0–400.0)
RBC: 4.49 Mil/uL (ref 4.22–5.81)
RDW: 12.9 % (ref 11.5–15.5)
WBC: 4.5 10*3/uL (ref 4.0–10.5)

## 2019-12-10 LAB — LIPID PANEL
Cholesterol: 106 mg/dL (ref 0–200)
HDL: 39.7 mg/dL (ref >39.00)
LDL Cholesterol: 50 mg/dL (ref 0–99)
NonHDL: 66.02
Total CHOL/HDL Ratio: 3
Triglycerides: 81 mg/dL (ref 0.0–149.0)
VLDL: 16.2 mg/dL (ref 0.0–40.0)

## 2019-12-10 LAB — LDL CHOLESTEROL, DIRECT: Direct LDL: 47 mg/dL

## 2019-12-10 MED ORDER — SILDENAFIL CITRATE 20 MG PO TABS: ORAL_TABLET | ORAL | 1 refills | Status: DC

## 2019-12-10 NOTE — Patient Instructions (Signed)
Health Maintenance, Male Adopting a healthy lifestyle and getting preventive care are important in promoting health and wellness. Ask your health care provider about:  The right schedule for you to have regular tests and exams.  Things you can do on your own to prevent diseases and keep yourself healthy. What should I know about diet, weight, and exercise? Eat a healthy diet   Eat a diet that includes plenty of vegetables, fruits, low-fat dairy products, and lean protein.  Do not eat a lot of foods that are high in solid fats, added sugars, or sodium. Maintain a healthy weight Body mass index (BMI) is a measurement that can be used to identify possible weight problems. It estimates body fat based on height and weight. Your health care provider can help determine your BMI and help you achieve or maintain a healthy weight. Get regular exercise Get regular exercise. This is one of the most important things you can do for your health. Most adults should:  Exercise for at least 150 minutes each week. The exercise should increase your heart rate and make you sweat (moderate-intensity exercise).  Do strengthening exercises at least twice a week. This is in addition to the moderate-intensity exercise.  Spend less time sitting. Even light physical activity can be beneficial. Watch cholesterol and blood lipids Have your blood tested for lipids and cholesterol at 57 years of age, then have this test every 5 years. You may need to have your cholesterol levels checked more often if:  Your lipid or cholesterol levels are high.  You are older than 57 years of age.  You are at high risk for heart disease. What should I know about cancer screening? Many types of cancers can be detected early and may often be prevented. Depending on your health history and family history, you may need to have cancer screening at various ages. This may include screening for:  Colorectal cancer.  Prostate  cancer.  Skin cancer.  Lung cancer. What should I know about heart disease, diabetes, and high blood pressure? Blood pressure and heart disease  High blood pressure causes heart disease and increases the risk of stroke. This is more likely to develop in people who have high blood pressure readings, are of African descent, or are overweight.  Talk with your health care provider about your target blood pressure readings.  Have your blood pressure checked: ? Every 3-5 years if you are 57-57 years of age. ? Every year if you are 57 years old or older.  If you are between the ages of 65 and 75 and are a current or former smoker, ask your health care provider if you should have a one-time screening for abdominal aortic aneurysm (AAA). Diabetes Have regular diabetes screenings. This checks your fasting blood sugar level. Have the screening done:  Once every three years after age 57 if you are at a normal weight and have a low risk for diabetes.  More often and at a younger age if you are overweight or have a high risk for diabetes. What should I know about preventing infection? Hepatitis B If you have a higher risk for hepatitis B, you should be screened for this virus. Talk with your health care provider to find out if you are at risk for hepatitis B infection. Hepatitis C Blood testing is recommended for:  Everyone born from 1945 through 1965.  Anyone with known risk factors for hepatitis C. Sexually transmitted infections (STIs)  You should be screened each year   for STIs, including gonorrhea and chlamydia, if: ? You are sexually active and are younger than 57 years of age. ? You are older than 57 years of age and your health care provider tells you that you are at risk for this type of infection. ? Your sexual activity has changed since you were last screened, and you are at increased risk for chlamydia or gonorrhea. Ask your health care provider if you are at risk.  Ask your  health care provider about whether you are at high risk for HIV. Your health care provider may recommend a prescription medicine to help prevent HIV infection. If you choose to take medicine to prevent HIV, you should first get tested for HIV. You should then be tested every 3 months for as long as you are taking the medicine. Follow these instructions at home: Lifestyle  Do not use any products that contain nicotine or tobacco, such as cigarettes, e-cigarettes, and chewing tobacco. If you need help quitting, ask your health care provider.  Do not use street drugs.  Do not share needles.  Ask your health care provider for help if you need support or information about quitting drugs. Alcohol use  Do not drink alcohol if your health care provider tells you not to drink.  If you drink alcohol: ? Limit how much you have to 0-2 drinks a day. ? Be aware of how much alcohol is in your drink. In the [place] one drink equals one 12 oz bottle of beer (355 mL), one 5 oz glass of wine (148 mL), or one 1 oz glass of hard liquor (44 mL). General instructions  Schedule regular health, dental, and eye exams.  Stay current with your vaccines.  Tell your health care provider if: ? You often feel depressed. ? You have ever been abused or do not feel safe at home. Summary  Adopting a healthy lifestyle and getting preventive care are important in promoting health and wellness.  Follow your health care provider's instructions about healthy diet, exercising, and getting tested or screened for diseases.  Follow your health care provider's instructions on monitoring your cholesterol and blood pressure. This information is not intended to replace advice given to you by your health care provider. Make sure you discuss any questions you have with your health care provider. Document Revised: 08/01/2018 Document Reviewed: 08/01/2018 Elsevier Patient Education  2020 Elsevier Inc.  Preventive Care 57-57 Years  Old, Male Preventive care refers to lifestyle choices and visits with your health care provider that can promote health and wellness. This includes:  A yearly physical exam. This is also called an annual well check.  Regular dental and eye exams.  Immunizations.  Screening for certain conditions.  Healthy lifestyle choices, such as eating a healthy diet, getting regular exercise, not using drugs or products that contain nicotine and tobacco, and limiting alcohol use. What can I expect for my preventive care visit? Physical exam Your health care provider will check:  Height and weight. These may be used to calculate body mass index (BMI), which is a measurement that tells if you are at a healthy weight.  Heart rate and blood pressure.  Your skin for abnormal spots. Counseling Your health care provider may ask you questions about:  Alcohol, tobacco, and drug use.  Emotional well-being.  Home and relationship well-being.  Sexual activity.  Eating habits.  Work and work environment. What immunizations do I need?  Influenza (flu) vaccine  This is recommended every year. Tetanus, diphtheria,   and pertussis (Tdap) vaccine  You may need a Td booster every 10 years. Varicella (chickenpox) vaccine  You may need this vaccine if you have not already been vaccinated. Zoster (shingles) vaccine  You may need this after age 63. Measles, mumps, and rubella (MMR) vaccine  You may need at least one dose of MMR if you were born in 1957 or later. You may also need a second dose. Pneumococcal conjugate (PCV13) vaccine  You may need this if you have certain conditions and were not previously vaccinated. Pneumococcal polysaccharide (PPSV23) vaccine  You may need one or two doses if you smoke cigarettes or if you have certain conditions. Meningococcal conjugate (MenACWY) vaccine  You may need this if you have certain conditions. Hepatitis A vaccine  You may need this if you have  certain conditions or if you travel or work in places where you may be exposed to hepatitis A. Hepatitis B vaccine  You may need this if you have certain conditions or if you travel or work in places where you may be exposed to hepatitis B. Haemophilus influenzae type b (Hib) vaccine  You may need this if you have certain risk factors. Human papillomavirus (HPV) vaccine  If recommended by your health care provider, you may need three doses over 6 months. You may receive vaccines as individual doses or as more than one vaccine together in one shot (combination vaccines). Talk with your health care provider about the risks and benefits of combination vaccines. What tests do I need? Blood tests  Lipid and cholesterol levels. These may be checked every 5 years, or more frequently if you are over 68 years old.  Hepatitis C test.  Hepatitis B test. Screening  Lung cancer screening. You may have this screening every year starting at age 78 if you have a 30-pack-year history of smoking and currently smoke or have quit within the past 15 years.  Prostate cancer screening. Recommendations will vary depending on your family history and other risks.  Colorectal cancer screening. All adults should have this screening starting at age 38 and continuing until age 22. Your health care provider may recommend screening at age 73 if you are at increased risk. You will have tests every 1-10 years, depending on your results and the type of screening test.  Diabetes screening. This is done by checking your blood sugar (glucose) after you have not eaten for a while (fasting). You may have this done every 1-3 years.  Sexually transmitted disease (STD) testing. Follow these instructions at home: Eating and drinking  Eat a diet that includes fresh fruits and vegetables, whole grains, lean protein, and low-fat dairy products.  Take vitamin and mineral supplements as recommended by your health care  provider.  Do not drink alcohol if your health care provider tells you not to drink.  If you drink alcohol: ? Limit how much you have to 0-2 drinks a day. ? Be aware of how much alcohol is in your drink. In the [place] one drink equals one 12 oz bottle of beer (355 mL), one 5 oz glass of wine (148 mL), or one 1 oz glass of hard liquor (44 mL). Lifestyle  Take daily care of your teeth and gums.  Stay active. Exercise for at least 30 minutes on 5 or more days each week.  Do not use any products that contain nicotine or tobacco, such as cigarettes, e-cigarettes, and chewing tobacco. If you need help quitting, ask your health care provider.  If  you are sexually active, practice safe sex. Use a condom or other form of protection to prevent STIs (sexually transmitted infections).  Talk with your health care provider about taking a low-dose aspirin every day starting at age 50. What's next?  Go to your health care provider once a year for a well check visit.  Ask your health care provider how often you should have your eyes and teeth checked.  Stay up to date on all vaccines. This information is not intended to replace advice given to you by your health care provider. Make sure you discuss any questions you have with your health care provider. Document Revised: 08/02/2018 Document Reviewed: 08/02/2018 Elsevier Patient Education  2020 Elsevier Inc.  

## 2019-12-10 NOTE — Progress Notes (Signed)
Established Patient Office Visit  Subjective:  Patient ID: Maurice Hernandez, male    DOB: 17-Jul-1961  Age: 59 y.o. MRN: [id number]  CC:  Chief Complaint  Patient presents with  . Annual Exam    CPE, no concerns.     HPI [name] presents for routine physical.  He has been doing quite well.  He left his stressful job and has been feeling great.  He has 2 strong prospects on the horizon.  Has been exercising and eating properly.  Has been able to access dental and eye care.  Has had no further issue with chest pain shortness of breath.  No issues with bowel habits.  He does experience some post void dribble and nocturia.  His not enough to bother him at this time.  Past Medical History:  Diagnosis Date  . Bell's palsy 2003  . CAD (coronary artery disease)    NSTEMI 11-15-16  . Hypertension   . Myocardial infarction [facility]) 10/2016    Past Surgical History:  Procedure Laterality Date  . INGUINAL HERNIA REPAIR Left 1968  . INGUINAL HERNIA REPAIR Right 2001  . WISDOM TOOTH EXTRACTION      Family History  Problem Relation Age of Onset  . COPD Mother        Non Smoker (63)   . Hypertension Mother   . Heart disease Father        CABG (3-4 vessel)   . Hypertension Sister   . Dementia Sister   . Cancer Sister        Hodgkin's Disease (16); Breast Cancer (Bilateral Mastectomy)   . Hypertension Sister   . Alzheimer's disease Paternal Aunt   . Alzheimer's disease Paternal Uncle   . Stomach cancer Maternal Aunt   . Colon cancer Neg Hx   . Esophageal cancer Neg Hx   . Rectal cancer Neg Hx   . Diabetes Neg Hx     Social History   Socioeconomic History  . Marital status: Divorced    Spouse name: Not on file  . Number of children: Not on file  . Years of education: Not on file  . Highest education level: Not on file  Occupational History  . Occupation: Doctor, general practice: other - [place]    Comment: [employer]  Tobacco Use  . Smoking status: Never Smoker  . Smokeless  tobacco: Never Used  Substance and Sexual Activity  . Alcohol use: Yes    Alcohol/week: 3.0 standard drinks    Types: 3 Cans of beer per week    Comment: Ocasionally   . Drug use: No  . Sexual activity: Not Currently  Other Topics Concern  . 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 Difficulty of Paying Living  Expenses:   Food Insecurity:   . Worried About Charity fundraiser in the Last Year:   . Arboriculturist in the Last Year:   Transportation Needs:   . Film/video editor (Medical):   [name] Lack of Transportation (Non-Medical):   Physical Activity:   . Days of Exercise per Week:   . Minutes of Exercise per Session:   Stress:   . Feeling of Stress :   Social Connections:   . Frequency of Communication with Friends and Family:   . Frequency of Social Gatherings with Friends and Family:   . Attends Religious Services:   . Active Member of Clubs or Organizations:   . Attends Archivist Meetings:   [name] Marital Status:   Intimate Partner Violence:   . Fear of Current or Ex-Partner:   . Emotionally Abused:   [name] Physically Abused:   . Sexually Abused:     Outpatient Medications Prior to Visit  Medication Sig Dispense Refill  . aspirin EC 81 MG tablet Take 81 mg by mouth daily.    [name] atorvastatin (LIPITOR) 40 MG tablet Take 40 mg by mouth daily.    [name] lisinopril (PRINIVIL,ZESTRIL) 10 MG tablet Take 10 mg by mouth daily.    . metoprolol tartrate (LOPRESSOR) 25 MG tablet Take 1 tablet (25 mg total) by  mouth 2 (two) times daily. 180 tablet 0  . Multiple Vitamin (MULTI-VITAMINS) TABS Take 1 tablet by mouth daily.    . sildenafil (REVATIO) 20 MG tablet Take one to three tablets daily 45 minutes prior to intercourse as needed 50 tablet 1  . BRILINTA 90 MG TABS tablet Take 1 tablet (90 mg total) by mouth 2 (two) times daily. (Patient not taking: Reported on 12/10/2019) 180 tablet 0  . escitalopram (LEXAPRO) 10 MG tablet Take one every other day for 2 weeks, then every 3rd day for 2 weeks and every 4th day for 2 weeks and stop. (Patient not taking: Reported on 12/10/2019) 30 tablet 0   No facility-administered medications prior to visit.    No Known Allergies  ROS Review of Systems  Constitutional: Negative.   HENT: Negative.   Eyes: Negative for visual disturbance.  Respiratory: Negative.   Cardiovascular: Negative.   Gastrointestinal: Negative.   Endocrine: Negative for polyphagia and polyuria.  Genitourinary: Negative for decreased urine volume, difficulty urinating, discharge, frequency and urgency.  Musculoskeletal: Negative for gait problem and joint swelling.  Allergic/Immunologic: Negative for immunocompromised state.  Neurological: Negative for speech difficulty and light-headedness.  Hematological: Does not bruise/bleed easily.  Psychiatric/Behavioral: Negative.    Depression screen [facility] 2/9 12/10/2019 02/19/2018 12/10/2014  Decreased Interest 0 0 0  Down, Depressed, Hopeless 0 0 0  PHQ - 2 Score 0 0 0  Altered sleeping - 0 -  Tired, decreased energy - 0 -  Change in appetite - 0 -  Feeling bad or failure about yourself  - 0 -  Trouble concentrating - 0 -  Moving slowly or fidgety/restless - 0 -  Suicidal thoughts - 0 -  PHQ-9 Score - 0 -      Objective:    Physical Exam  Constitutional: He is oriented to person, place, and time. He appears well-developed and well-nourished. No distress.  HENT:  Head: Normocephalic and atraumatic.  Right Ear: External ear normal.    Left Ear: External ear normal.  Eyes: Conjunctivae are normal. Right eye exhibits no discharge. Left eye exhibits no discharge. No scleral icterus.  Neck:  No JVD present. No tracheal deviation present. No thyromegaly present.  Cardiovascular: Normal rate, regular rhythm and normal heart sounds.  Pulmonary/Chest: Effort normal and breath sounds normal. No stridor.  Abdominal: Soft. Bowel sounds are normal. He exhibits no distension. There is no abdominal tenderness. There is no rebound and no guarding. Hernia confirmed negative in the right inguinal area and confirmed negative in the left inguinal area.  Genitourinary: Rectum:     Guaiac result negative.     No rectal mass, anal fissure, tenderness, external hemorrhoid, internal hemorrhoid or abnormal anal tone.  Prostate is enlarged. Prostate is not tender. Right testis shows no mass, no swelling and no tenderness. Right testis is descended. Left testis shows no mass, no swelling and no tenderness. Left testis is descended. Circumcised. No hypospadias, penile erythema or penile tenderness. No discharge found.  Musculoskeletal:        General: No edema.  Lymphadenopathy:    He has no cervical adenopathy.       Right: No inguinal adenopathy present.       Left: No inguinal adenopathy present.  Neurological: He is alert and oriented to person, place, and time.  Skin: Skin is warm and dry. He is not diaphoretic.  Psychiatric: He has a normal mood and affect. His behavior is normal.    BP 108/64   Pulse (!) 57   Temp (!) 97 F (36.1 C) (Tympanic)   Ht 6\' 1"  (1.854 m)   Wt 187 lb (84.8 kg)   SpO2 98%   BMI 24.67 kg/m  Wt Readings from Last 3 Encounters:  12/10/19 187 lb (84.8 kg)  07/09/19 196 lb (88.9 kg)  06/28/18 187 lb (84.8 kg)     There are no preventive care reminders to display for this patient.  There are no preventive care reminders to display for this patient.  Lab Results  Component Value Date   TSH 1.91 06/28/2018    Lab Results  Component Value Date   WBC 4.2 06/28/2018   HGB 15.2 06/28/2018   HCT 44.1 06/28/2018   MCV 92.7 06/28/2018   PLT 170.0 06/28/2018   Lab Results  Component Value Date   NA 139 06/28/2018   K 3.7 06/28/2018   CO2 30 06/28/2018   GLUCOSE 97 06/28/2018   BUN 13 06/28/2018   CREATININE 1.05 06/28/2018   BILITOT 1.1 06/28/2018   ALKPHOS 61 06/28/2018   AST 30 06/28/2018   ALT 35 06/28/2018   PROT 6.9 06/28/2018   ALBUMIN 4.6 06/28/2018   CALCIUM 9.5 06/28/2018   GFR 77.89 06/28/2018   Lab Results  Component Value Date   CHOL 127 06/28/2018   Lab Results  Component Value Date   HDL 42.00 06/28/2018   Lab Results  Component Value Date   LDLCALC 62 06/28/2018   Lab Results  Component Value Date   TRIG 117.0 06/28/2018   Lab Results  Component Value Date   CHOLHDL 3 06/28/2018   Lab Results  Component Value Date   HGBA1C 5.7 11/24/2016      Assessment & Plan:   Problem List Items Addressed This Visit      Cardiovascular and Mediastinum   CAD (coronary artery disease)   Relevant Orders   CBC   LDL cholesterol, direct   Lipid panel   Comprehensive metabolic panel    Other Visit Diagnoses    Healthcare maintenance    -  Primary   Relevant Orders   CBC   HIV Antibody (routine  testing w rflx)   PSA   Urinalysis, Routine w reflex microscopic   Urine cytology ancillary only   Comprehensive metabolic panel      No orders of the defined types were placed in this encounter.   Follow-up: Return in about 6 months (around 06/10/2020).   Given information on health maintenance and disease prevention.  Not interested in eating his BPH symptoms at this time.  Suggested avoiding fluids a couple hours before bedtime to avoid nocturia.  Encouraged him to continue his healthy lifestyle and follow-up with cardiology.  Good luck with his new job. Libby Maw, MD

## 2019-12-11 LAB — URINE CYTOLOGY ANCILLARY ONLY
Chlamydia: NEGATIVE
Comment: NEGATIVE

## 2019-12-11 LAB — HIV ANTIBODY (ROUTINE TESTING W REFLEX): HIV 1&2 Ab, 4th Generation: NONREACTIVE

## 2020-01-07 DIAGNOSIS — L738 Other specified follicular disorders: Secondary | ICD-10-CM | POA: Diagnosis not present

## 2020-01-07 DIAGNOSIS — L720 Epidermal cyst: Secondary | ICD-10-CM | POA: Diagnosis not present

## 2020-01-07 DIAGNOSIS — L821 Other seborrheic keratosis: Secondary | ICD-10-CM | POA: Diagnosis not present

## 2020-01-07 DIAGNOSIS — D229 Melanocytic nevi, unspecified: Secondary | ICD-10-CM | POA: Diagnosis not present

## 2020-02-05 DIAGNOSIS — I10 Essential (primary) hypertension: Secondary | ICD-10-CM | POA: Diagnosis not present

## 2020-02-05 DIAGNOSIS — Z7982 Long term (current) use of aspirin: Secondary | ICD-10-CM | POA: Diagnosis not present

## 2020-02-05 DIAGNOSIS — E782 Mixed hyperlipidemia: Secondary | ICD-10-CM | POA: Diagnosis not present

## 2020-02-05 DIAGNOSIS — I2510 Atherosclerotic heart disease of native coronary artery without angina pectoris: Secondary | ICD-10-CM | POA: Diagnosis not present

## 2020-03-01 DIAGNOSIS — Z03818 Encounter for observation for suspected exposure to other biological agents ruled out: Secondary | ICD-10-CM | POA: Diagnosis not present

## 2020-03-01 DIAGNOSIS — Z20822 Contact with and (suspected) exposure to covid-19: Secondary | ICD-10-CM | POA: Diagnosis not present

## 2020-04-14 DIAGNOSIS — M720 Palmar fascial fibromatosis [Dupuytren]: Secondary | ICD-10-CM | POA: Diagnosis not present

## 2020-06-01 ENCOUNTER — Other Ambulatory Visit: Payer: Self-pay

## 2020-06-02 ENCOUNTER — Encounter: Payer: Self-pay | Admitting: Family Medicine

## 2020-06-02 ENCOUNTER — Other Ambulatory Visit (HOSPITAL_COMMUNITY)
Admission: RE | Admit: 2020-06-02 | Discharge: 2020-06-02 | Disposition: A | Discharge status: Home / Self Care | Payer: BC Managed Care – PPO | Source: Ambulatory Visit | Attending: Family Medicine | Admitting: Family Medicine

## 2020-06-02 ENCOUNTER — Ambulatory Visit (INDEPENDENT_AMBULATORY_CARE_PROVIDER_SITE_OTHER): Payer: BC Managed Care – PPO | Admitting: Family Medicine

## 2020-06-02 VITALS — BP 115/68 | HR 55 | Temp 97.5°F | Ht 73.0 in | Wt 189.4 lb

## 2020-06-02 DIAGNOSIS — N5201 Erectile dysfunction due to arterial insufficiency: Secondary | ICD-10-CM

## 2020-06-02 DIAGNOSIS — Z202 Contact with and (suspected) exposure to infections with a predominantly sexual mode of transmission: Secondary | ICD-10-CM | POA: Insufficient documentation

## 2020-06-02 DIAGNOSIS — R351 Nocturia: Secondary | ICD-10-CM | POA: Diagnosis not present

## 2020-06-02 DIAGNOSIS — N401 Enlarged prostate with lower urinary tract symptoms: Secondary | ICD-10-CM | POA: Diagnosis not present

## 2020-06-02 DIAGNOSIS — Z23 Encounter for immunization: Secondary | ICD-10-CM | POA: Diagnosis not present

## 2020-06-02 LAB — URINALYSIS, ROUTINE W REFLEX MICROSCOPIC
Bilirubin Urine: NEGATIVE
Hgb urine dipstick: NEGATIVE
Ketones, ur: NEGATIVE
Leukocytes,Ua: NEGATIVE
Nitrite: NEGATIVE
RBC / HPF: NONE SEEN (ref 0–?)
Specific Gravity, Urine: 1.02 (ref 1.000–1.030)
Total Protein, Urine: NEGATIVE
Urine Glucose: NEGATIVE
Urobilinogen, UA: 0.2 (ref 0.0–1.0)
WBC, UA: NONE SEEN (ref 0–?)
pH: 6 (ref 5.0–8.0)

## 2020-06-02 MED ORDER — SILDENAFIL CITRATE 20 MG PO TABS: ORAL_TABLET | ORAL | 1 refills | Status: DC

## 2020-06-02 NOTE — Patient Instructions (Signed)

## 2020-06-02 NOTE — Progress Notes (Signed)
Established Patient Office Visit  Subjective:  Patient ID: Maurice Hernandez, male    DOB: 20-Dec-1960  Age: 59 y.o. MRN: [id number]  CC:  Chief Complaint  Patient presents with  . Urinary Frequency    C/O urine frequency x 2-3 months becoming worse.     HPI Maurice Hernandez presents for evaluation of the 2 to 26-month history of nocturia up to 2-3 times nightly.  He does consume fluids just prior to retiring.  Denies any decrease in the force of his stream pre or post void dribbling or difficulty initiating his stream.  There was some discomfort with urination status post bowel movement a few days ago.  Denies nausea fever chills discharge or dysuria otherwise.  He is with a stable partner.  Request refill sildenafil.  This medication works well for him.  Past Medical History:  Diagnosis Date  . Bell's palsy 2003  . CAD (coronary artery disease)    NSTEMI 11-15-16  . Hypertension   . Myocardial infarction [facility]) 10/2016    Past Surgical History:  Procedure Laterality Date  . INGUINAL HERNIA REPAIR Left 1968  . INGUINAL HERNIA REPAIR Right 2001  . WISDOM TOOTH EXTRACTION      Family History  Problem Relation Age of Onset  . COPD Mother        Non Smoker (26)   . Hypertension Mother   . Heart disease Father        CABG (3-4 vessel)   . Hypertension Sister   . Dementia Sister   . Cancer Sister        Hodgkin's Disease (16); Breast Cancer (Bilateral Mastectomy)   . Hypertension Sister   . Alzheimer's disease Paternal Aunt   . Alzheimer's disease Paternal Uncle   . Stomach cancer Maternal Aunt   . Colon cancer Neg Hx   . Esophageal cancer Neg Hx   . Rectal cancer Neg Hx   . Diabetes Neg Hx     Social History   Socioeconomic History  . Marital status: Divorced    Spouse name: Not on file  . Number of children: Not on file  . Years of education: Not on file  . Highest education level: Not on file  Occupational History  . 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 Smoking status: Never Smoker  . Smokeless tobacco: Never Used  Substance and Sexual Activity  . Alcohol use: Yes    Alcohol/week: 3.0 standard drinks    Types: 3 Cans of beer per week    Comment: Ocasionally   . Drug use: No  . Sexual activity: Not Currently  Other Topics Concern  . 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  Difficulty of Paying Living Expenses: Not on file  Food Insecurity:   . Worried About Charity fundraiser in the Last Year: Not on file  . Ran Out of Food in the Last Year: Not on file  Transportation Needs:   . Lack of Transportation (Medical): Not on file  . Lack of Transportation (Non-Medical): Not on file  Physical Activity:   . Days of Exercise per Week: Not on file  . Minutes of Exercise per Session: Not on file  Stress:   . Feeling of Stress : Not on file  Social Connections:   . Frequency of Communication with Friends and Family: Not on file  . Frequency of Social Gatherings with Friends and Family: Not on file  . Attends Religious Services: Not on file  . Active Member of Clubs or Organizations: Not on file  . Attends Archivist Meetings: Not on file  . Marital Status: Not on file  Intimate Partner Violence:   . Fear of Current or Ex-Partner: Not on file  . Emotionally Abused: Not on file  . Physically Abused: Not on file  . Sexually Abused: Not on file    Outpatient Medications Prior to Visit  Medication Sig Dispense Refill  .  aspirin EC 81 MG tablet Take 81 mg by mouth daily.    [name] atorvastatin (LIPITOR) 40 MG tablet Take 40 mg by mouth daily.    . metoprolol tartrate (LOPRESSOR) 25 MG tablet Take 1 tablet (25 mg total) by mouth 2 (two) times daily. 180 tablet 0  . Multiple Vitamin (MULTI-VITAMINS) TABS Take 1 tablet by mouth daily.    . sildenafil (REVATIO) 20 MG tablet Take one to three tablets daily 45 minutes prior to intercourse as needed 50 tablet 1  . lisinopril (PRINIVIL,ZESTRIL) 10 MG tablet Take 10 mg by mouth daily.     No facility-administered medications prior to visit.    No Known Allergies  ROS Review of Systems  Constitutional: Negative.   HENT: Negative.   Respiratory: Negative.   Cardiovascular: Negative.   Gastrointestinal: Negative.   Endocrine: Negative for polyphagia and polyuria.  Genitourinary: Negative for difficulty urinating, discharge, dysuria, frequency, hematuria, testicular pain and urgency.  Musculoskeletal: Negative for gait problem and joint swelling.  Skin: Negative for pallor and rash.  Allergic/Immunologic: Negative for immunocompromised state.  Neurological: Negative for syncope and speech difficulty.  Hematological: Does not bruise/bleed easily.  Psychiatric/Behavioral: Negative.       Objective:    Physical Exam Vitals and nursing note reviewed.  Constitutional:      General: He is not in acute distress.    Appearance: Normal appearance. He is not ill-appearing, toxic-appearing or diaphoretic.  HENT:     Head: Normocephalic and atraumatic.     Right Ear: External ear normal.     Left Ear: External ear normal.     Mouth/Throat:     Pharynx: Oropharynx is clear. No oropharyngeal exudate or posterior oropharyngeal erythema.  Eyes:     General: No scleral icterus.       Right eye: No discharge.        Left eye: No discharge.     Conjunctiva/sclera: Conjunctivae normal.  Cardiovascular:     Rate and Rhythm: Normal rate and regular rhythm.  Pulmonary:      Effort: Pulmonary effort is normal.     Breath sounds: Normal breath sounds.  Abdominal:     General: Bowel sounds are normal.  Genitourinary:    Prostate: Enlarged.  Not tender and no nodules present.     Rectum: Guaiac result negative. No mass, tenderness, anal fissure, external hemorrhoid or internal hemorrhoid. Normal anal tone.  Musculoskeletal:     Cervical back: No rigidity or tenderness.  Lymphadenopathy:     Cervical: No cervical adenopathy.  Skin:    General: Skin is warm and dry.  Neurological:     Mental Status: He is alert and oriented to person, place, and time.  Psychiatric:        Mood and Affect: Mood normal.        Behavior: Behavior normal.     BP 115/68   Pulse (!) 55   Temp (!) 97.5 F (36.4 C) (Tympanic)   Ht 6\' 1"  (1.854 m)   Wt 189 lb 6.4 oz (85.9 kg)   SpO2 96%   BMI 24.99 kg/m  Wt Readings from Last 3 Encounters:  06/02/20 189 lb 6.4 oz (85.9 kg)  12/10/19 187 lb (84.8 kg)  07/09/19 196 lb (88.9 kg)     Health Maintenance Due  Topic Date Due  . INFLUENZA VACCINE  03/22/2020    There are no preventive care reminders to display for this patient.  Lab Results  Component Value Date   TSH 1.91 06/28/2018   Lab Results  Component Value Date   WBC 4.5 12/10/2019   HGB 14.3 12/10/2019   HCT 41.9 12/10/2019   MCV 93.2 12/10/2019   PLT 187.0 12/10/2019   Lab Results  Component Value Date   NA 137 12/10/2019   K 4.1 12/10/2019   CO2 31 12/10/2019   GLUCOSE 90 12/10/2019   BUN 18 12/10/2019   CREATININE 1.07 12/10/2019   BILITOT 1.1 12/10/2019   ALKPHOS 69 12/10/2019   AST 31 12/10/2019   ALT 30 12/10/2019   PROT 6.7 12/10/2019   ALBUMIN 4.6 12/10/2019   CALCIUM 9.5 12/10/2019   GFR 71.33 12/10/2019   Lab Results  Component Value Date   CHOL 106 12/10/2019   Lab Results  Component Value Date   HDL 39.70 12/10/2019   Lab Results  Component Value Date   LDLCALC 50 12/10/2019   Lab Results  Component Value Date   TRIG  81.0 12/10/2019   Lab Results  Component Value Date   CHOLHDL 3 12/10/2019   Lab Results  Component Value Date   HGBA1C 5.7 11/24/2016      Assessment & Plan:   Problem List Items Addressed This Visit      Other   Erectile dysfunction   Relevant Medications   sildenafil (REVATIO) 20 MG tablet   Potential exposure to STD   Relevant Orders   Urine cytology ancillary only   HIV Antibody (routine testing w rflx)    Other Visit Diagnoses    Need for influenza vaccination    -  Primary   Relevant Orders   Flu Vaccine QUAD 6+ mos PF IM (Fluarix Quad PF)   Nocturia associated with benign prostatic hyperplasia       Relevant Orders   Urinalysis, Routine w reflex microscopic      Meds ordered this encounter  Medications  . sildenafil (REVATIO) 20 MG tablet    Sig: Take one to three tablets daily 45 minutes prior to intercourse as needed    Dispense:  50 tablet    Refill:  1    Follow-up: Return if symptoms worsen or fail to improve.  Information given on BPH.  Advised him to avoid fluid intake 2 hours  prior to retiring.  If nocturia persists he will return to the clinic and we will discuss treatment options.  Libby Maw, MD

## 2020-06-03 LAB — URINE CYTOLOGY ANCILLARY ONLY
Chlamydia: NEGATIVE
Comment: NEGATIVE

## 2020-06-03 LAB — HIV ANTIBODY (ROUTINE TESTING W REFLEX): HIV 1&2 Ab, 4th Generation: NONREACTIVE

## 2020-06-10 ENCOUNTER — Ambulatory Visit: Payer: BC Managed Care – PPO | Admitting: Family Medicine

## 2020-06-12 DIAGNOSIS — D3131 Benign neoplasm of right choroid: Secondary | ICD-10-CM | POA: Diagnosis not present

## 2020-06-12 DIAGNOSIS — H02831 Dermatochalasis of right upper eyelid: Secondary | ICD-10-CM | POA: Diagnosis not present

## 2020-06-12 DIAGNOSIS — B0229 Other postherpetic nervous system involvement: Secondary | ICD-10-CM | POA: Diagnosis not present

## 2020-06-12 DIAGNOSIS — H02834 Dermatochalasis of left upper eyelid: Secondary | ICD-10-CM | POA: Diagnosis not present

## 2020-06-15 ENCOUNTER — Other Ambulatory Visit: Payer: Self-pay

## 2020-06-16 ENCOUNTER — Ambulatory Visit: Payer: BC Managed Care – PPO | Admitting: Family Medicine

## 2020-06-16 ENCOUNTER — Ambulatory Visit: Payer: BC Managed Care – PPO

## 2020-06-16 ENCOUNTER — Encounter: Payer: Self-pay | Admitting: Family Medicine

## 2020-06-16 VITALS — BP 120/74 | HR 49 | Temp 97.6°F | Ht 73.0 in | Wt 190.8 lb

## 2020-06-16 DIAGNOSIS — R208 Other disturbances of skin sensation: Secondary | ICD-10-CM | POA: Diagnosis not present

## 2020-06-16 DIAGNOSIS — M545 Low back pain, unspecified: Secondary | ICD-10-CM | POA: Diagnosis not present

## 2020-06-16 MED ORDER — MELOXICAM 7.5 MG PO TABS: ORAL_TABLET | ORAL | 0 refills | Status: DC

## 2020-06-16 NOTE — Patient Instructions (Signed)

## 2020-06-16 NOTE — Progress Notes (Signed)
Established Patient Office Visit  Subjective:  Patient ID: Maurice Hernandez, male    DOB: [birth year]  Age: 59 y.o. MRN: [id number]  CC:  Chief Complaint  Patient presents with  . Acute Visit    c/o having thrombing in eye on/off x 2 weeks, & lower back pain x 1 week.  no OTC meds taken    HPI Maurice Hernandez presents for for 2 problems.  For the last 2 months he has been having fleeting sensations of numbness that move from the right temporal area of his face through the lateral side of his call.  These sensations are fleeting lasting only seconds.  They seem to come and go on their own.  They have been increasing in frequency over the last few weeks.  There is been no pain, change in vision, nausea or vomiting, weakness on one side of the body and not the other, difficulty swallowing.  Distant history of Bell's palsy palsy affecting the right side of his face in 2003.  No history of migraines or family history of migraines. 2 to 3-day history of lower back pain.  Was to the left but is now centrally located.  There is no radiation of pain.  Denies weakness numbness or tingling in his lower extremities.  No changes in his bowel or bladder function no specific injury.  Past Medical History:  Diagnosis Date  . Bell's palsy 2003  . CAD (coronary artery disease)    NSTEMI 11-15-16  . Hypertension   . Myocardial infarction [facility]) 10/2016    Past Surgical History:  Procedure Laterality Date  . INGUINAL HERNIA REPAIR Left 1968  . INGUINAL HERNIA REPAIR Right 2001  . WISDOM TOOTH EXTRACTION      Family History  Problem Relation Age of Onset  . COPD Mother        Non Smoker (49)   . Hypertension Mother   . Heart disease Father        CABG (3-4 vessel)   . Hypertension Sister   . Dementia Sister   . Cancer Sister        Hodgkin's Disease (16); Breast Cancer (Bilateral Mastectomy)   . Hypertension Sister   . Alzheimer's disease Paternal Aunt   . Alzheimer's disease Paternal Uncle   .  Stomach cancer Maternal Aunt   . Colon cancer Neg Hx   . Esophageal cancer Neg Hx   . Rectal cancer Neg Hx   . Diabetes Neg Hx     Social History   Socioeconomic History  . Marital status: Divorced    Spouse name: Not on file  . Number of children: Not on file  . Years of education: Not on file  . Highest education level: Not on file  Occupational History  . Occupation: Doctor, general practice: other - [employer]    Comment: [employer]  Tobacco Use  . Smoking status: Never Smoker  . Smokeless tobacco: Never Used  Substance and Sexual Activity  . Alcohol use: Yes    Alcohol/week: 3.0 standard drinks    Types: 3 Cans of beer per week    Comment: Ocasionally   . Drug use: No  . Sexual activity: Not Currently  Other Topics Concern  . 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 Difficulty of Paying Living Expenses: Not on file  Food Insecurity:   . Worried About Charity fundraiser in the Last Year: Not on file  . Ran Out of Food in the Last Year: Not on file  Transportation Needs:   . Lack of Transportation (Medical): Not on file  . Lack of Transportation (Non-Medical): Not on file  Physical Activity:   . Days of Exercise per Week: Not on file  . Minutes of Exercise per Session: Not on file  Stress:   . Feeling of Stress : Not on file  Social Connections:   . Frequency of Communication with Friends and Family: Not on file  . Frequency of Social Gatherings with Friends and Family: Not on file  . Attends Religious  Services: Not on file  . Active Member of Clubs or Organizations: Not on file  . Attends Archivist Meetings: Not on file  . Marital Status: Not on file  Intimate Partner Violence:   . Fear of Current or Ex-Partner: Not on file  . Emotionally Abused: Not on file  . Physically Abused: Not on file  . Sexually Abused: Not on file    Outpatient Medications Prior to Visit  Medication Sig Dispense Refill  . aspirin EC 81 MG tablet Take 81 mg by mouth daily.    [name] atorvastatin (LIPITOR) 40 MG tablet Take 40 mg by mouth daily.    [name] lisinopril (PRINIVIL,ZESTRIL) 10 MG tablet Take 10 mg by mouth daily.    . metoprolol tartrate (LOPRESSOR) 25 MG tablet Take 1 tablet (25 mg total) by mouth 2 (two) times daily. 180 tablet 0  . Multiple Vitamin (MULTI-VITAMINS) TABS Take 1 tablet by mouth daily.    . sildenafil (REVATIO) 20 MG tablet Take one to three tablets daily 45 minutes prior to intercourse as needed 50 tablet 1   No facility-administered medications prior to visit.    No Known Allergies  ROS Review of Systems  Constitutional: Negative.   HENT: Negative.   Eyes: Negative for photophobia and visual disturbance.  Respiratory: Negative.   Cardiovascular: Negative.   Gastrointestinal: Negative.   Genitourinary: Negative.   Musculoskeletal: Positive for back pain. Negative for myalgias.  Skin: Negative for pallor and rash.  Neurological: Positive for numbness. Negative for dizziness, seizures, facial asymmetry, speech difficulty, weakness, light-headedness and headaches.  Hematological: Does not bruise/bleed easily.  Psychiatric/Behavioral: Negative.       Objective:    Physical Exam Vitals and nursing note reviewed.  Constitutional:      General: He is not in acute distress.    Appearance: Normal appearance. He is normal weight. He is not ill-appearing, toxic-appearing or diaphoretic.  HENT:     Head: Normocephalic and atraumatic.     Right Ear: Tympanic membrane, ear  canal and external ear normal.     Left Ear: Tympanic membrane, ear canal and external ear normal.     Mouth/Throat:     Mouth: Mucous membranes  are moist.     Pharynx: Oropharynx is clear. No oropharyngeal exudate or posterior oropharyngeal erythema.  Eyes:     General:        Right eye: No discharge.        Left eye: No discharge.     Extraocular Movements: Extraocular movements intact.     Conjunctiva/sclera: Conjunctivae normal.     Pupils: Pupils are equal, round, and reactive to light.  Cardiovascular:     Rate and Rhythm: Normal rate and regular rhythm.  Pulmonary:     Effort: Pulmonary effort is normal.     Breath sounds: Normal breath sounds.  Musculoskeletal:     Cervical back: No rigidity or tenderness.  Lymphadenopathy:     Cervical: No cervical adenopathy.  Skin:    General: Skin is warm and dry.     Coloration: Skin is not jaundiced.     Findings: No rash.  Neurological:     General: No focal deficit present.     Mental Status: He is alert and oriented to person, place, and time.     Cranial Nerves: No cranial nerve deficit.     Sensory: No sensory deficit.     Motor: No weakness.     Coordination: Coordination normal.     Gait: Gait normal.     Deep Tendon Reflexes: Reflexes normal.  Psychiatric:        Mood and Affect: Mood normal.        Behavior: Behavior normal.     BP 120/74   Pulse (!) 49   Temp 97.6 F (36.4 C) (Temporal)   Ht 6\' 1"  (1.854 m)   Wt 190 lb 12.8 oz (86.5 kg)   SpO2 96%   BMI 25.17 kg/m  Wt Readings from Last 3 Encounters:  06/16/20 190 lb 12.8 oz (86.5 kg)  06/02/20 189 lb 6.4 oz (85.9 kg)  12/10/19 187 lb (84.8 kg)     There are no preventive care reminders to display for this patient.  There are no preventive care reminders to display for this patient.  Lab Results  Component Value Date   TSH 1.91 06/28/2018   Lab Results  Component Value Date   WBC 4.5 12/10/2019   HGB 14.3 12/10/2019   HCT 41.9 12/10/2019    MCV 93.2 12/10/2019   PLT 187.0 12/10/2019   Lab Results  Component Value Date   NA 137 12/10/2019   K 4.1 12/10/2019   CO2 31 12/10/2019   GLUCOSE 90 12/10/2019   BUN 18 12/10/2019   CREATININE 1.07 12/10/2019   BILITOT 1.1 12/10/2019   ALKPHOS 69 12/10/2019   AST 31 12/10/2019   ALT 30 12/10/2019   PROT 6.7 12/10/2019   ALBUMIN 4.6 12/10/2019   CALCIUM 9.5 12/10/2019   GFR 71.33 12/10/2019   Lab Results  Component Value Date   CHOL 106 12/10/2019   Lab Results  Component Value Date   HDL 39.70 12/10/2019   Lab Results  Component Value Date   LDLCALC 50 12/10/2019   Lab Results  Component Value Date   TRIG 81.0 12/10/2019   Lab Results  Component Value Date   CHOLHDL 3 12/10/2019   Lab Results  Component Value Date   HGBA1C 5.7 11/24/2016      Assessment & Plan:   Problem List Items Addressed This Visit      Other   Dysesthesia of scalp - Primary   Relevant Orders   MR Brain W Wo Contrast  Ambulatory referral to Neurology   Midline low back pain without sciatica   Relevant Medications   meloxicam (MOBIC) 7.5 MG tablet   Other Relevant Orders   DG Lumbar Spine Complete      Meds ordered this encounter  Medications  . meloxicam (MOBIC) 7.5 MG tablet    Sig: Take one each day for 10 days and then as needed.    Dispense:  30 tablet    Refill:  0    Follow-up: Return if symptoms worsen or fail to improve.    Libby Maw, MD

## 2020-06-19 ENCOUNTER — Encounter: Payer: Self-pay | Admitting: Neurology

## 2020-06-30 ENCOUNTER — Other Ambulatory Visit: Payer: Self-pay

## 2020-06-30 ENCOUNTER — Ambulatory Visit (INDEPENDENT_AMBULATORY_CARE_PROVIDER_SITE_OTHER): Payer: BC Managed Care – PPO

## 2020-06-30 ENCOUNTER — Encounter: Payer: Self-pay | Admitting: Family

## 2020-06-30 ENCOUNTER — Ambulatory Visit: Payer: BC Managed Care – PPO | Admitting: Family

## 2020-06-30 VITALS — BP 122/78 | HR 54 | Temp 97.7°F | Ht 73.0 in | Wt 192.8 lb

## 2020-06-30 DIAGNOSIS — M5416 Radiculopathy, lumbar region: Secondary | ICD-10-CM

## 2020-06-30 DIAGNOSIS — M545 Low back pain, unspecified: Secondary | ICD-10-CM | POA: Diagnosis not present

## 2020-06-30 MED ORDER — PREDNISONE 20 MG PO TABS: 20.0000 mg | ORAL_TABLET | Freq: Every day | ORAL | 0 refills | Status: AC

## 2020-06-30 NOTE — Patient Instructions (Signed)
Lumbosacral Radiculopathy Lumbosacral radiculopathy is a condition that involves the spinal nerves and nerve roots in the low back and bottom of the spine. The condition develops when these nerves and nerve roots move out of place or become inflamed and cause symptoms. What are the causes? This condition may be caused by:  Pressure from a disk that bulges out of place (herniated disk). A disk is a plate of soft cartilage that separates bones in the spine.  Disk changes that occur with age (disk degeneration).  A narrowing of the bones of the lower back (spinal stenosis).  A tumor.  An infection.  An injury that places sudden pressure on the disks that cushion the bones of your lower spine. What increases the risk? You are more likely to develop this condition if:  You are a male who is 30-50 years old.  You are a male who is 50-60 years old.  You use improper technique when lifting things.  You are overweight or live a sedentary lifestyle.  Your work requires frequent lifting.  You smoke.  You do repetitive activities that strain the spine. What are the signs or symptoms? Symptoms of this condition include:  Pain that goes down from your back into your legs (sciatica), usually on one side of the body. This is the most common symptom. The pain may be worse with sitting, coughing, or sneezing.  Pain and numbness in your legs.  Muscle weakness.  Tingling.  Loss of bladder control or bowel control. How is this diagnosed? This condition may be diagnosed based on:  Your symptoms and medical history.  A physical exam. If the pain is lasting, you may have tests, such as:  MRI scan.  X-ray.  CT scan.  A type of X-ray used to examine the spinal canal after injecting a dye into your spine (myelogram).  A test to measure how electrical impulses move through a nerve (nerve conduction study). How is this treated? Treatment may depend on the cause of the condition and  may include:  Working with a physical therapist.  Taking pain medicine.  Applying heat and ice to affected areas.  Doing stretches to improve flexibility.  Doing exercises to strengthen back muscles.  Having chiropractic spinal manipulation.  Using transcutaneous electrical nerve stimulation (TENS) therapy.  Getting a steroid injection in the spine. In some cases, no treatment is needed. If the condition is long-lasting (chronic), or if symptoms are severe, treatment may involve surgery or lifestyle changes, such as following a weight-loss plan. Follow these instructions at home: Activity  Avoid bending and other activities that make the problem worse.  Maintain a proper position when standing or sitting: ? When standing, keep your upper back and neck straight, with your shoulders pulled back. Avoid slouching. ? When sitting, keep your back straight and relax your shoulders. Do not round your shoulders or pull them backward.  Do not sit or stand in one place for long periods of time.  Take brief periods of rest throughout the day. This will reduce your pain. It is usually better to rest by lying down or standing, not sitting.  When you are resting for longer periods, mix in some mild activity or stretching between periods of rest. This will help to prevent stiffness and pain.  Get regular exercise. Ask your health care provider what activities are safe for you. If you were shown how to do any exercises or stretches, do them as directed by your health care provider.  Do   not lift anything that is heavier than 10 lb (4.5 kg) or the limit that you are told by your health care provider. Always use proper lifting technique, which includes: ? Bending your knees. ? Keeping the load close to your body. ? Avoiding twisting. Managing pain  If directed, put ice on the affected area: ? Put ice in a plastic bag. ? Place a towel between your skin and the bag. ? Leave the ice on for 20  minutes, 2-3 times a day.  If directed, apply heat to the affected area as often as told by your health care provider. Use the heat source that your health care provider recommends, such as a moist heat pack or a heating pad. ? Place a towel between your skin and the heat source. ? Leave the heat on for 20-30 minutes. ? Remove the heat if your skin turns bright red. This is especially important if you are unable to feel pain, heat, or cold. You may have a greater risk of getting burned.  Take over-the-counter and prescription medicines only as told by your health care provider. General instructions  Sleep on a firm mattress in a comfortable position. Try lying on your side with your knees slightly bent. If you lie on your back, put a pillow under your knees.  Do not drive or use heavy machinery while taking prescription pain medicine.  If your health care provider prescribed a diet or exercise program, follow it as directed.  Keep all follow-up visits as told by your health care provider. This is important. Contact a health care provider if:  Your pain does not improve over time, even when taking pain medicines. Get help right away if:  You develop severe pain.  Your pain suddenly gets worse.  You develop increasing weakness in your legs.  You lose the ability to control your bladder or bowel.  You have difficulty walking or balancing.  You have a fever. Summary  Lumbosacral radiculopathy is a condition that occurs when the spinal nerves and nerve roots in the lower part of the spine move out of place or become inflamed and cause symptoms.  Symptoms include pain, numbness, and tingling that go down from your back into your legs (sciatica), muscle weakness, and loss of bladder control or bowel control.  If directed, apply ice or heat to the affected area as told by your health care provider.  Follow instructions about activity, rest, and proper lifting technique. This  information is not intended to replace advice given to you by your health care provider. Make sure you discuss any questions you have with your health care provider. Document Revised: 07/27/2017 Document Reviewed: 07/27/2017 Elsevier Patient Education  [place].    Sciatica Rehab Ask your health care provider which exercises are safe for you. Do exercises exactly as told by your health care provider and adjust them as directed. It is normal to feel mild stretching, pulling, tightness, or discomfort as you do these exercises. Stop right away if you feel sudden pain or your pain gets worse. Do not begin these exercises until told by your health care provider. Stretching and range-of-motion exercises These exercises warm up your muscles and joints and improve the movement and flexibility of your hips and back. These exercises also help to relieve pain, numbness, and tingling. Sciatic nerve glide 1. Sit in a chair with your head facing down toward your chest. Place your hands behind your back. Let your shoulders slump forward. 2. Slowly straighten  one of your legs while you tilt your head back as if you are looking toward the ceiling. Only straighten your leg as far as you can without making your symptoms worse. 3. Hold this position for __________ seconds. 4. Slowly return to the starting position. 5. Repeat with your other leg. Repeat __________ times. Complete this exercise __________ times a day. Knee to chest with hip adduction and internal rotation  1. Lie on your back on a firm surface with both legs straight. 2. Bend one of your knees and move it up toward your chest until you feel a gentle stretch in your lower back and buttock. Then, move your knee toward the shoulder that is on the opposite side from your leg. This is hip adduction and internal rotation. ? Hold your leg in this position by holding on to the front of your knee. 3. Hold this position for __________  seconds. 4. Slowly return to the starting position. 5. Repeat with your other leg. Repeat __________ times. Complete this exercise __________ times a day. Prone extension on elbows  1. Lie on your abdomen on a firm surface. A bed may be too soft for this exercise. 2. Prop yourself up on your elbows. 3. Use your arms to help lift your chest up until you feel a gentle stretch in your abdomen and your lower back. ? This will place some of your body weight on your elbows. If this is uncomfortable, try stacking pillows under your chest. ? Your hips should stay down, against the surface that you are lying on. Keep your hip and back muscles relaxed. 4. Hold this position for __________ seconds. 5. Slowly relax your upper body and return to the starting position. Repeat __________ times. Complete this exercise __________ times a day. Strengthening exercises These exercises build strength and endurance in your back. Endurance is the ability to use your muscles for a long time, even after they get tired. Pelvic tilt This exercise strengthens the muscles that lie deep in the abdomen. 1. Lie on your back on a firm surface. Bend your knees and keep your feet flat on the floor. 2. Tense your abdominal muscles. Tip your pelvis up toward the ceiling and flatten your lower back into the floor. ? To help with this exercise, you may place a small towel under your lower back and try to push your back into the towel. 3. Hold this position for __________ seconds. 4. Let your muscles relax completely before you repeat this exercise. Repeat __________ times. Complete this exercise __________ times a day. Alternating arm and leg raises  1. Get on your hands and knees on a firm surface. If you are on a hard floor, you may want to use padding, such as an exercise mat, to cushion your knees. 2. Line up your arms and legs. Your hands should be directly below your shoulders, and your knees should be directly below your  hips. 3. Lift your left leg behind you. At the same time, raise your right arm and straighten it in front of you. ? Do not lift your leg higher than your hip. ? Do not lift your arm higher than your shoulder. ? Keep your abdominal and back muscles tight. ? Keep your hips facing the ground. ? Do not arch your back. ? Keep your balance carefully, and do not hold your breath. 4. Hold this position for __________ seconds. 5. Slowly return to the starting position. 6. Repeat with your right leg and your left  arm. Repeat __________ times. Complete this exercise __________ times a day. Posture and body mechanics Good posture and healthy body mechanics can help to relieve stress in your body's tissues and joints. Body mechanics refers to the movements and positions of your body while you do your daily activities. Posture is part of body mechanics. Good posture means:  Your spine is in its natural S-curve position (neutral).  Your shoulders are pulled back slightly.  Your head is not tipped forward. Follow these guidelines to improve your posture and body mechanics in your everyday activities. Standing   When standing, keep your spine neutral and your feet about hip width apart. Keep a slight bend in your knees. Your ears, shoulders, and hips should line up.  When you do a task in which you stand in one place for a long time, place one foot up on a stable object that is 2-4 inches (5-10 cm) high, such as a footstool. This helps keep your spine neutral. Sitting   When sitting, keep your spine neutral and keep your feet flat on the floor. Use a footrest, if necessary, and keep your thighs parallel to the floor. Avoid rounding your shoulders, and avoid tilting your head forward.  When working at a desk or a computer, keep your desk at a height where your hands are slightly lower than your elbows. Slide your chair under your desk so you are close enough to maintain good posture.  When working at  a computer, place your monitor at a height where you are looking straight ahead and you do not have to tilt your head forward or downward to look at the screen. Resting  When lying down and resting, avoid positions that are most painful for you.  If you have pain with activities such as sitting, bending, stooping, or squatting, lie in a position in which your body does not bend very much. For example, avoid curling up on your side with your arms and knees near your chest (fetal position).  If you have pain with activities such as standing for a long time or reaching with your arms, lie with your spine in a neutral position and bend your knees slightly. Try the following positions: ? Lying on your side with a pillow between your knees. ? Lying on your back with a pillow under your knees. Lifting   When lifting objects, keep your feet at least shoulder width apart and tighten your abdominal muscles.  Bend your knees and hips and keep your spine neutral. It is important to lift using the strength of your legs, not your back. Do not lock your knees straight out.  Always ask for help to lift heavy or awkward objects. This information is not intended to replace advice given to you by your health care provider. Make sure you discuss any questions you have with your health care provider. Document Revised: 11/30/2018 Document Reviewed: 08/30/2018 Elsevier Patient Education  [place].

## 2020-07-01 DIAGNOSIS — M5126 Other intervertebral disc displacement, lumbar region: Secondary | ICD-10-CM | POA: Diagnosis not present

## 2020-07-01 DIAGNOSIS — M5416 Radiculopathy, lumbar region: Secondary | ICD-10-CM | POA: Diagnosis not present

## 2020-07-01 DIAGNOSIS — M9904 Segmental and somatic dysfunction of sacral region: Secondary | ICD-10-CM | POA: Diagnosis not present

## 2020-07-01 DIAGNOSIS — M9903 Segmental and somatic dysfunction of lumbar region: Secondary | ICD-10-CM | POA: Diagnosis not present

## 2020-07-01 NOTE — Progress Notes (Signed)
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 Complete the Prednisone and use the mobic as needed after completion. We can do a referral to ortho as needed

## 2020-07-01 NOTE — Progress Notes (Signed)
Acute Office Visit  Subjective:    Patient ID: Maurice Hernandez, male    DOB: Aug 24, 1960, 59 y.o.   MRN: [id number]  Chief Complaint  Patient presents with  . Pain    righ hip pain radiating to calf, righ foot numbness. Symptoms x 4 days patient very restless do to pain.     HPI Patient is in today with c/o pain in the right hip extending down the leg and numbness to the right foot x 4 days. Pain is about 4/10 with movement. Has a known history of arthritis to the lower back. Is planning to see a chiropractor today. Was seen by Dr. Ethelene Hal 2 weeks ago and prescribed Mobic that did not help much. The discomfort prevents him from sleeping. No acute injury  Past Medical History:  Diagnosis Date  . Bell's palsy 2003  . CAD (coronary artery disease)    NSTEMI 11-15-16  . Hypertension   . Myocardial infarction [facility]) 10/2016    Past Surgical History:  Procedure Laterality Date  . INGUINAL HERNIA REPAIR Left 1968  . INGUINAL HERNIA REPAIR Right 2001  . WISDOM TOOTH EXTRACTION      Family History  Problem Relation Age of Onset  . COPD Mother        Non Smoker (50)   . Hypertension Mother   . Heart disease Father        CABG (3-4 vessel)   . Hypertension Sister   . Dementia Sister   . Cancer Sister        Hodgkin's Disease (16); Breast Cancer (Bilateral Mastectomy)   . Hypertension Sister   . Alzheimer's disease Paternal Aunt   . Alzheimer's disease Paternal Uncle   . Stomach cancer Maternal Aunt   . Colon cancer Neg Hx   . Esophageal cancer Neg Hx   . Rectal cancer Neg Hx   . Diabetes Neg Hx     Social History   Socioeconomic History  . Marital status: Divorced    Spouse name: Not on file  . Number of children: Not on file  . Years of education: Not on file  . Highest education level: Not on file  Occupational History  . Occupation: Doctor, general practice: other - [employer]    Comment: [employer]  Tobacco Use  . Smoking status: Never Smoker  . Smokeless tobacco: Never Used   Substance and Sexual Activity  . Alcohol use: Yes    Alcohol/week: 3.0 standard drinks    Types: 3 Cans of beer per week    Comment: Ocasionally   . Drug use: No  . Sexual activity: Not Currently  Other Topics Concern  . 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 Difficulty of Paying Living Expenses: Not on  file  Food Insecurity:   . Worried About Charity fundraiser in the Last Year: Not on file  . Ran Out of Food in the Last Year: Not on file  Transportation Needs:   . Lack of Transportation (Medical): Not on file  . Lack of Transportation (Non-Medical): Not on file  Physical Activity:   . Days of Exercise per Week: Not on file  . Minutes of Exercise per Session: Not on file  Stress:   . Feeling of Stress : Not on file  Social Connections:   . Frequency of Communication with Friends and Family: Not on file  . Frequency of Social Gatherings with Friends and Family: Not on file  . Attends Religious Services: Not on file  . Active Member of Clubs or Organizations: Not on file  . Attends Archivist Meetings: Not on file  . Marital Status: Not on file  Intimate Partner Violence:   . Fear of Current or Ex-Partner: Not on file  . Emotionally Abused: Not on file  . Physically Abused: Not on file  . Sexually Abused: Not on file    Outpatient Medications Prior to Visit  Medication Sig Dispense Refill  . aspirin EC 81 MG tablet Take 81 mg by mouth daily.    [name] atorvastatin (LIPITOR) 40 MG tablet Take 40 mg  by mouth daily.    . meloxicam (MOBIC) 7.5 MG tablet Take one each day for 10 days and then as needed. 30 tablet 0  . metoprolol tartrate (LOPRESSOR) 25 MG tablet Take 1 tablet (25 mg total) by mouth 2 (two) times daily. 180 tablet 0  . Multiple Vitamin (MULTI-VITAMINS) TABS Take 1 tablet by mouth daily.    . sildenafil (REVATIO) 20 MG tablet Take one to three tablets daily 45 minutes prior to intercourse as needed 50 tablet 1  . lisinopril (PRINIVIL,ZESTRIL) 10 MG tablet Take 10 mg by mouth daily.     No facility-administered medications prior to visit.    No Known Allergies  Review of Systems  Constitutional: Negative.   Respiratory: Negative.   Cardiovascular: Negative.   Musculoskeletal: Positive for arthralgias and back pain.       Right hip pain that radiates down the leg  Skin: Negative.   Allergic/Immunologic: Negative.   Neurological: Positive for weakness and numbness.       Right lower extremity  Psychiatric/Behavioral: Negative.   All other systems reviewed and are negative.      Objective:    Physical Exam Constitutional:      Appearance: Normal appearance. He is normal weight.  HENT:     Mouth/Throat:     Mouth: Mucous membranes are moist.  Cardiovascular:     Rate and Rhythm: Normal rate and regular rhythm.  Pulmonary:     Effort: Pulmonary effort is normal.     Breath sounds: Normal breath sounds.  Abdominal:     General: Abdomen is flat.     Palpations: Abdomen is soft.  Musculoskeletal:        General: No swelling or deformity.     Cervical back: Normal range of motion and neck supple.     Comments: Tenderness to the right sciatic nerve  Skin:    General: Skin is warm and dry.     Capillary Refill: Capillary refill takes less than 2 seconds.  Neurological:     Mental Status: He is alert.  Psychiatric:        Mood and Affect: Mood normal.  BP 122/78   Pulse (!) 54   Temp 97.7 F (36.5 C) (Tympanic)   Ht 6\' 1"  (1.854 m)   Wt 192 lb 12.8  oz (87.5 kg)   SpO2 96%   BMI 25.44 kg/m  Wt Readings from Last 3 Encounters:  06/30/20 192 lb 12.8 oz (87.5 kg)  06/16/20 190 lb 12.8 oz (86.5 kg)  06/02/20 189 lb 6.4 oz (85.9 kg)    There are no preventive care reminders to display for this patient.  There are no preventive care reminders to display for this patient.   Lab Results  Component Value Date   TSH 1.91 06/28/2018   Lab Results  Component Value Date   WBC 4.5 12/10/2019   HGB 14.3 12/10/2019   HCT 41.9 12/10/2019   MCV 93.2 12/10/2019   PLT 187.0 12/10/2019   Lab Results  Component Value Date   NA 137 12/10/2019   K 4.1 12/10/2019   CO2 31 12/10/2019   GLUCOSE 90 12/10/2019   BUN 18 12/10/2019   CREATININE 1.07 12/10/2019   BILITOT 1.1 12/10/2019   ALKPHOS 69 12/10/2019   AST 31 12/10/2019   ALT 30 12/10/2019   PROT 6.7 12/10/2019   ALBUMIN 4.6 12/10/2019   CALCIUM 9.5 12/10/2019   GFR 71.33 12/10/2019   Lab Results  Component Value Date   CHOL 106 12/10/2019   Lab Results  Component Value Date   HDL 39.70 12/10/2019   Lab Results  Component Value Date   LDLCALC 50 12/10/2019   Lab Results  Component Value Date   TRIG 81.0 12/10/2019   Lab Results  Component Value Date   CHOLHDL 3 12/10/2019   Lab Results  Component Value Date   HGBA1C 5.7 11/24/2016       Assessment & Plan:   Problem List Items Addressed This Visit    None    Visit Diagnoses    Lumbar radiculopathy    -  Primary   Relevant Orders   DG Lumbar Spine Complete   DG Lumbar Spine Complete (Completed)       Meds ordered this encounter  Medications  . predniSONE (DELTASONE) 20 MG tablet    Sig: Take 1 tablet (20 mg total) by mouth daily with breakfast for 9 days. 3 po qam x 3 days 2 po qam x 3 days 1 po qam x 3 days    Dispense:  18 tablet    Refill:  0   Call the office with any questions or concerns. Follow-up pending xray and completion of medication  Kennyth Arnold, FNP

## 2020-07-04 ENCOUNTER — Ambulatory Visit
Admission: RE | Admit: 2020-07-04 | Discharge: 2020-07-04 | Disposition: A | Discharge status: Home / Self Care | Payer: BC Managed Care – PPO | Source: Ambulatory Visit | Attending: Family Medicine | Admitting: Family Medicine

## 2020-07-04 DIAGNOSIS — R208 Other disturbances of skin sensation: Secondary | ICD-10-CM

## 2020-07-06 ENCOUNTER — Telehealth: Payer: Self-pay

## 2020-07-06 DIAGNOSIS — M5416 Radiculopathy, lumbar region: Secondary | ICD-10-CM

## 2020-07-06 MED ORDER — DIAZEPAM 5 MG PO TABS: 5.0000 mg | ORAL_TABLET | Freq: Two times a day (BID) | ORAL | 0 refills | Status: DC | PRN

## 2020-07-06 NOTE — Telephone Encounter (Signed)
Pt calling and requesting another referral to be sent out to [place], due to him leaving the appointment.  Pt stated that he was very nervous and could not do it.  They told pt that the referral would have to be resubmitted.  Pt is also asking for a add on to previous MRI, which would be of Rt hip area.  Pt was seen by Justin Mend about this last week, 07/01/20.  Pt did ask if Justin Mend could send something in to pharmacy to calm him, during the MRI or right before next appt.  Please advise.  CB#  [phone]

## 2020-07-07 NOTE — Telephone Encounter (Signed)
Please see message and advise.  Thank you. ° °

## 2020-07-07 NOTE — Telephone Encounter (Signed)
Maurice Hernandez was able to get auth approved 11/15 and sent to [facility]

## 2020-07-08 ENCOUNTER — Telehealth: Payer: Self-pay | Admitting: Family Medicine

## 2020-07-08 DIAGNOSIS — M5416 Radiculopathy, lumbar region: Secondary | ICD-10-CM

## 2020-07-08 NOTE — Telephone Encounter (Signed)
Pt wanted to know if you can put in a referral for Physical Therapy for him.

## 2020-07-09 DIAGNOSIS — M9903 Segmental and somatic dysfunction of lumbar region: Secondary | ICD-10-CM | POA: Diagnosis not present

## 2020-07-09 DIAGNOSIS — M5126 Other intervertebral disc displacement, lumbar region: Secondary | ICD-10-CM | POA: Diagnosis not present

## 2020-07-09 DIAGNOSIS — M9904 Segmental and somatic dysfunction of sacral region: Secondary | ICD-10-CM | POA: Diagnosis not present

## 2020-07-09 DIAGNOSIS — M5416 Radiculopathy, lumbar region: Secondary | ICD-10-CM | POA: Diagnosis not present

## 2020-07-21 ENCOUNTER — Other Ambulatory Visit: Payer: Self-pay

## 2020-07-21 ENCOUNTER — Ambulatory Visit: Payer: BC Managed Care – PPO | Attending: Family | Admitting: Physical Therapy

## 2020-07-21 ENCOUNTER — Encounter: Payer: Self-pay | Admitting: Physical Therapy

## 2020-07-21 DIAGNOSIS — M6281 Muscle weakness (generalized): Secondary | ICD-10-CM | POA: Insufficient documentation

## 2020-07-21 DIAGNOSIS — M5441 Lumbago with sciatica, right side: Secondary | ICD-10-CM | POA: Diagnosis not present

## 2020-07-21 NOTE — Therapy (Signed)
[place] [place], [place], [address] Phone: [phone]   Fax:  [phone]  Physical Therapy Evaluation  Patient Details  Name: Maurice Hernandez MRN: [id number] Date of Birth: [birth year] Referring Provider (PT): Janelle Floor Date: 07/21/2020   PT End of Session - 07/21/20 4235    Visit Number 1    Number of Visits 30    Date for PT Re-Evaluation 09/20/20    Authorization Type BCBS    PT Start Time 1533    PT Stop Time 1608    PT Time Calculation (min) 35 min    Activity Tolerance Patient tolerated treatment well    Behavior During Therapy [facility] for tasks assessed/performed           Past Medical History:  Diagnosis Date  . Bell's palsy 2003  . CAD (coronary artery disease)    NSTEMI 11-15-16  . Hypertension   . Myocardial infarction [facility]) 10/2016    Past Surgical History:  Procedure Laterality Date  . INGUINAL HERNIA REPAIR Left 1968  . INGUINAL HERNIA REPAIR Right 2001  . WISDOM TOOTH EXTRACTION      There were no vitals filed for this visit.    Subjective Assessment - 07/21/20 1535    Subjective Patient has  significant DDD in the L4-S1 area, has an anterolisthesis L5 on S1.  Reports that in the past 2 months he has had significantly more pain in the right buttock , has numbness in the right calf and below.  Reports that he changed jobs and was driving a lot more as well as changed to doing more squats with hi exercises    Limitations Sitting;Standing;Walking    Patient Stated Goals have less pain, no have surgery    Currently in Pain? Yes    Pain Score 3     Pain Location Buttocks    Pain Orientation Right;Posterior    Pain Descriptors / Indicators Sharp;Nagging    Pain Type Acute pain    Pain Radiating Towards right buttock and the posterior leg, numbness right mid calf down, reports weakness in the right toes    Pain Onset More than a month ago    Pain Frequency Constant     Aggravating Factors  sitting long periods, squatting, pain up to 5-6/10    Pain Relieving Factors rest, massage at best pain a 2/10    Effect of Pain on Daily Activities difficulty sleeping, difficulty driving              [facility] PT Assessment - 07/21/20 0001      Assessment   Medical Diagnosis LBP with right hip pain    Referring Provider (PT) Justin Mend    Onset Date/Surgical Date 06/20/20    Prior Therapy no      Precautions   Precautions None      Balance Screen   Has the patient fallen in the past 6 months No    Has the patient had a decrease in activity level because of a fear of falling?  No    Is the patient reluctant to leave their home because of a fear of falling?  No      Home Environment   Additional Comments does housework, does yardwork      Prior Function   Level of Independence Independent    Vocation Full time employment    Vocation Requirements sitting    Leisure 3x/week cardio, running, weights 4x/week  Posture/Postural Control   Posture Comments slouched sitting posture, fwd head, rounded shoulders      ROM / Strength   AROM / PROM / Strength AROM;Strength      AROM   Overall AROM Comments Lumbar flexion WNL's, extension and side bending to the right are limited with some pain      Strength   Overall Strength Comments right hip flexion 4-/5    Strength Assessment Site Ankle    Right/Left Ankle Right    Right Ankle Dorsiflexion 3+/5    Right Ankle Plantar Flexion 4/5    Right Ankle Inversion 4-/5    Right Ankle Eversion 4/5      Flexibility   Soft Tissue Assessment /Muscle Length yes    Hamstrings 45 degrees with pain bilateral    Piriformis very tight with some pain      Palpation   Palpation comment tight in the lumbar area and the right buttock, mild tenderness in the right buttock                      Objective measurements completed on examination: See above findings.                 PT Short Term Goals -  07/21/20 1720      PT SHORT TERM GOAL #1   Title independent with initial HEP    Time 2    Period Weeks    Status New             PT Long Term Goals - 07/21/20 1720      PT LONG TERM GOAL #1   Title understand posture and body mechanics    Time 8    Period Weeks    Status New      PT LONG TERM GOAL #2   Title decrease pain 50%    Time 8    Period Weeks    Status New      PT LONG TERM GOAL #3   Title increase SLR to 75 degrees    Baseline 40 degrees at eval    Time 8    Period Weeks    Status New      PT LONG TERM GOAL #4   Title increase right hip strength to 4+/5    Time 8    Period Weeks    Status New                  Plan - 07/21/20 1615    Clinical Impression Statement Patient reports that over the past 6-8 weeks he has had some right buttock and back pain, reports noticing right numbness in the lower calf and weakness for right DF.  His HS and piriformis mms are very tight, x-rays show DDD and an anterolisthesis.    Stability/Clinical Decision Making Stable/Uncomplicated    Clinical Decision Making Low    Rehab Potential Good    PT Frequency 2x / week    PT Duration 8 weeks    PT Treatment/Interventions ADLs/Self Care Home Management;Electrical Stimulation;Moist Heat;Traction;Therapeutic activities;Therapeutic exercise;Neuromuscular re-education;Patient/family education;Manual techniques    PT Next Visit Plan work on flexibility and core strength, has MRI on 08/06/20    Consulted and Agree with Plan of Care Patient           Patient will benefit from skilled therapeutic intervention in order to improve the following deficits and impairments:  Abnormal gait, Decreased strength, Impaired sensation, Impaired flexibility, Improper body  mechanics, Pain, Difficulty walking, Decreased range of motion  Visit Diagnosis: Acute right-sided low back pain with right-sided sciatica - Plan: PT plan of care cert/re-cert  Muscle weakness (generalized) - Plan:  PT plan of care cert/re-cert     Problem List Patient Active Problem List   Diagnosis Date Noted  . Dysesthesia of scalp 06/16/2020  . Midline low back pain without sciatica 06/16/2020  . Chest wall contusion, right, initial encounter 07/09/2019  . Atypical nevi 06/28/2018  . PCP NOTES >>>>>>>>>>>> 11/29/2016  . CAD (coronary artery disease) 11/29/2016  . High risk sexual behavior 11/23/2015  . Anxiety state 06/17/2015  . Potential exposure to STD 12/16/2014  . Encounter for health maintenance examination with abnormal findings 12/10/2014  . Plantar fasciitis of left foot 12/10/2014  . Colon cancer screening 05/21/2014  . Erectile dysfunction 08/24/2013  . Other malaise and fatigue 08/24/2013  . Essential hypertension, benign 07/20/2013  . Screening for prostate cancer 07/20/2013  . Routine general medical examination at a health care facility 07/20/2013    Sumner Boast., PT 07/21/2020, 5:23 PM  [place]. [place], [place], [address] Phone: [phone]   Fax:  [phone]  Name: LENOARD HELBERT MRN: [id number] Date of Birth: 04/09/1961

## 2020-07-21 NOTE — Patient Instructions (Signed)
Access Code: [email or web address] URL: [email or web address] Date: 07/21/2020 Prepared by: Lum Babe  Exercises Supine Hamstring Stretch with Strap - 2 x daily - 7 x weekly - 1 sets - 10 reps - 30 hold Supine Hamstring Stretch with Doorway - 2 x daily - 7 x weekly - 1 sets - 10 reps - 30 hold Seated Table Hamstring Stretch - 2 x daily - 7 x weekly - 1 sets - 10 reps - 30 hold Supine Piriformis Stretch Pulling Heel to Hip - 2 x daily - 7 x weekly - 1 sets - 10 reps - 30 hold

## 2020-07-31 ENCOUNTER — Ambulatory Visit: Payer: BC Managed Care – PPO | Attending: Family | Admitting: Physical Therapy

## 2020-07-31 ENCOUNTER — Other Ambulatory Visit: Payer: Self-pay

## 2020-07-31 DIAGNOSIS — M6281 Muscle weakness (generalized): Secondary | ICD-10-CM | POA: Insufficient documentation

## 2020-07-31 DIAGNOSIS — M5441 Lumbago with sciatica, right side: Secondary | ICD-10-CM | POA: Insufficient documentation

## 2020-07-31 NOTE — Therapy (Signed)
[place]. [place], [place], [address] Phone: [phone]   Fax:  [phone]  Physical Therapy Treatment  Patient Details  Name: Maurice Hernandez MRN: [id number] Date of Birth: 1961-05-19 Referring Provider (PT): Janelle Floor Date: 07/31/2020   PT End of Session - 07/31/20 0915    Visit Number 2    Number of Visits 30    Date for PT Re-Evaluation 09/20/20    PT Start Time 0845    PT Stop Time 0915    PT Time Calculation (min) 30 min           Past Medical History:  Diagnosis Date  . Bell's palsy 2003  . CAD (coronary artery disease)    NSTEMI 11-15-16  . Hypertension   . Myocardial infarction [facility]) 10/2016    Past Surgical History:  Procedure Laterality Date  . INGUINAL HERNIA REPAIR Left 1968  . INGUINAL HERNIA REPAIR Right 2001  . WISDOM TOOTH EXTRACTION      There were no vitals filed for this visit.   Subjective Assessment - 07/31/20 0847    Subjective doing better overall and not doing HEP like I should- walkinga dn minimal running. just numbness in RT calf and trouble lifting RT toes/foot up    Currently in Pain? No/denies                             [facility] Adult PT Treatment/Exercise - 07/31/20 0001      Exercises   Exercises Lumbar      Lumbar Exercises: Stretches   Active Hamstring Stretch Right;2 reps;30 seconds   on step   Passive Hamstring Stretch Right;3 reps;10 seconds    Lower Trunk Rotation 3 reps;10 seconds    Gastroc Stretch Right;Left;10 seconds;5 reps   on step   Gastroc Stretch Limitations PROM      Lumbar Exercises: Aerobic   Nustep L 4 5 min      Lumbar Exercises: Prone   Other Prone Lumbar Exercises REIL 2 sets 5, REIL with PTA overpressure 2 sets 5      Modalities   Modalities Traction      Traction   Type of Traction Lumbar    Min (lbs) 50    Max (lbs) 60    Hold Time 60    Rest Time 20    Time had to stop after 3-5 min as he was  "too full " from eating                  PT Education - 07/31/20 0913    Education Details REIL, advised not to run d/t jarring, revied HS stetching and added calf step stretch, discussed shoe incert as he does pronate on RT but told him this was not causing numbness    Person(s) Educated Patient    Methods Explanation;Demonstration    Comprehension Verbalized understanding;Returned demonstration            PT Short Term Goals - 07/31/20 0915      PT SHORT TERM GOAL #1   Title independent with initial HEP    Status Achieved             PT Long Term Goals - 07/31/20 0915      PT LONG TERM GOAL #1   Title understand posture and body mechanics    Status Partially Met  Plan - 07/31/20 0915    Clinical Impression Statement pt arrives feeling better and reporting doing HEP "some" stated only issue is RT calf numbness and decreased ability to raise RT toes. reviewed stretching and added calf stetch as well as REIS. Pt is none tender in achilles but did not pronation on RT . Attempted traction but pt unable to tolerate as he started "too full from eating"           Patient will benefit from skilled therapeutic intervention in order to improve the following deficits and impairments:     Visit Diagnosis: Acute right-sided low back pain with right-sided sciatica  Muscle weakness (generalized)     Problem List Patient Active Problem List   Diagnosis Date Noted  . Dysesthesia of scalp 06/16/2020  . Midline low back pain without sciatica 06/16/2020  . Chest wall contusion, right, initial encounter 07/09/2019  . Atypical nevi 06/28/2018  . PCP NOTES >>>>>>>>>>>> 11/29/2016  . CAD (coronary artery disease) 11/29/2016  . High risk sexual behavior 11/23/2015  . Anxiety state 06/17/2015  . Potential exposure to STD 12/16/2014  . Encounter for health maintenance examination with abnormal findings 12/10/2014  . Plantar fasciitis of left foot  12/10/2014  . Colon cancer screening 05/21/2014  . Erectile dysfunction 08/24/2013  . Other malaise and fatigue 08/24/2013  . Essential hypertension, benign 07/20/2013  . Screening for prostate cancer 07/20/2013  . Routine general medical examination at a health care facility 07/20/2013    [facility] PTA 07/31/2020, 9:18 AM  [place]. [place], [place], [address] Phone: [phone]   Fax:  [phone]  Name: Maurice Hernandez MRN: [id number] Date of Birth: Jun 01, 1961

## 2020-08-03 ENCOUNTER — Other Ambulatory Visit: Payer: Self-pay | Admitting: Family Medicine

## 2020-08-03 ENCOUNTER — Other Ambulatory Visit: Payer: Self-pay | Admitting: Family

## 2020-08-03 DIAGNOSIS — R2990 Unspecified symptoms and signs involving the nervous system: Secondary | ICD-10-CM

## 2020-08-04 ENCOUNTER — Other Ambulatory Visit: Payer: BC Managed Care – PPO

## 2020-08-06 ENCOUNTER — Ambulatory Visit
Admission: RE | Admit: 2020-08-06 | Discharge: 2020-08-06 | Disposition: A | Discharge status: Home / Self Care | Payer: BC Managed Care – PPO | Source: Ambulatory Visit | Attending: Family Medicine | Admitting: Family Medicine

## 2020-08-06 ENCOUNTER — Other Ambulatory Visit: Payer: BC Managed Care – PPO

## 2020-08-06 ENCOUNTER — Other Ambulatory Visit: Payer: Self-pay

## 2020-08-06 ENCOUNTER — Ambulatory Visit
Admission: RE | Admit: 2020-08-06 | Discharge: 2020-08-06 | Disposition: A | Discharge status: Home / Self Care | Payer: BC Managed Care – PPO | Source: Ambulatory Visit | Attending: Family | Admitting: Family

## 2020-08-06 DIAGNOSIS — M545 Low back pain, unspecified: Secondary | ICD-10-CM | POA: Diagnosis not present

## 2020-08-06 DIAGNOSIS — M5416 Radiculopathy, lumbar region: Secondary | ICD-10-CM

## 2020-08-06 DIAGNOSIS — H05261 Pulsating exophthalmos, right eye: Secondary | ICD-10-CM | POA: Diagnosis not present

## 2020-08-06 DIAGNOSIS — M48061 Spinal stenosis, lumbar region without neurogenic claudication: Secondary | ICD-10-CM | POA: Diagnosis not present

## 2020-08-06 DIAGNOSIS — R2990 Unspecified symptoms and signs involving the nervous system: Secondary | ICD-10-CM

## 2020-08-06 MED ORDER — GADOBENATE DIMEGLUMINE 529 MG/ML IV SOLN
18.0000 mL | Freq: Once | INTRAVENOUS | Status: AC | PRN
  Administered 2020-08-06: 18:00:00 18 mL via INTRAVENOUS

## 2020-08-09 ENCOUNTER — Other Ambulatory Visit: Payer: Self-pay | Admitting: Family

## 2020-08-09 DIAGNOSIS — M5416 Radiculopathy, lumbar region: Secondary | ICD-10-CM

## 2020-08-09 DIAGNOSIS — M48061 Spinal stenosis, lumbar region without neurogenic claudication: Secondary | ICD-10-CM

## 2020-08-10 DIAGNOSIS — L738 Other specified follicular disorders: Secondary | ICD-10-CM | POA: Diagnosis not present

## 2020-08-10 DIAGNOSIS — L821 Other seborrheic keratosis: Secondary | ICD-10-CM | POA: Diagnosis not present

## 2020-08-10 DIAGNOSIS — X32XXXS Exposure to sunlight, sequela: Secondary | ICD-10-CM | POA: Diagnosis not present

## 2020-08-10 DIAGNOSIS — L814 Other melanin hyperpigmentation: Secondary | ICD-10-CM | POA: Diagnosis not present

## 2020-08-11 ENCOUNTER — Other Ambulatory Visit: Payer: Self-pay

## 2020-08-11 ENCOUNTER — Ambulatory Visit: Payer: BC Managed Care – PPO | Admitting: Physical Therapy

## 2020-08-11 DIAGNOSIS — M5441 Lumbago with sciatica, right side: Secondary | ICD-10-CM

## 2020-08-11 DIAGNOSIS — M6281 Muscle weakness (generalized): Secondary | ICD-10-CM | POA: Diagnosis not present

## 2020-08-11 NOTE — Therapy (Signed)
[place]. [place], [place], [address] Phone: [phone]   Fax:  [phone]  Physical Therapy Treatment  Patient Details  Name: Maurice Hernandez MRN: [id number] Date of Birth: 12/24/1960 Referring Provider (PT): Janelle Floor Date: 08/11/2020   PT End of Session - 08/11/20 0830    Visit Number 3    Number of Visits 30    Date for PT Re-Evaluation 09/20/20    PT Start Time 0800    PT Stop Time 0830    PT Time Calculation (min) 30 min           Past Medical History:  Diagnosis Date  . Bell's palsy 2003  . CAD (coronary artery disease)    NSTEMI 11-15-16  . Hypertension   . Myocardial infarction [facility]) 10/2016    Past Surgical History:  Procedure Laterality Date  . INGUINAL HERNIA REPAIR Left 1968  . INGUINAL HERNIA REPAIR Right 2001  . WISDOM TOOTH EXTRACTION      There were no vitals filed for this visit.   Subjective Assessment - 08/11/20 0802    Subjective 80% better. soreness RT side and still some radiating into RT foot. Back at gym ex and even some jogging.  MRI done . Neuro appt coming up    Currently in Pain? Yes    Pain Score 2     Pain Location Back    Pain Orientation Right                             [facility] Adult PT Treatment/Exercise - 08/11/20 0001      Lumbar Exercises: Aerobic   Elliptical L 3 38mn      Lumbar Exercises: Machines for Strengthening   Cybex Lumbar Extension black tband 2 sets 15    Other Lumbar Machine Exercise 35# seated row 2 sets 15    Other Lumbar Machine Exercise 35# lat pull 2 sets 15      Lumbar Exercises: Quadruped   Opposite Arm/Leg Raise Left arm/Right leg;Right arm/Left leg;10 reps;3 seconds                  PT Education - 08/11/20 0828    Education Details green tband row and shld ext, green tband hip ext and abd, bird dog and chair self taction    Person(s) Educated Patient    Methods  Explanation;Demonstration;Handout    Comprehension Verbalized understanding;Returned demonstration            PT Short Term Goals - 07/31/20 0915      PT SHORT TERM GOAL #1   Title independent with initial HEP    Status Achieved             PT Long Term Goals - 08/11/20 0829      PT LONG TERM GOAL #1   Title understand posture and body mechanics    Status Partially Met      PT LONG TERM GOAL #2   Title decrease pain 50%    Baseline 80%    Status Achieved      PT LONG TERM GOAL #3   Title increase SLR to 75 degrees    Baseline 50 degress    Status Partially Met      PT LONG TERM GOAL #4   Title increase right hip strength to 4+/5    Status On-going  Plan - 08/11/20 0830    Clinical Impression Statement pt arrives with 80% improvement. pt feels good with HEP and return to gym. edcu on gym return with focus on ext and rec elliptcial over TM and no running d/t jarring of spine. educ on chair self traction with relief. Also verb need to continue with stretching. LTGs partial met    PT Treatment/Interventions ADLs/Self Care Home Management;Electrical Stimulation;Moist Heat;Traction;Therapeutic activities;Therapeutic exercise;Neuromuscular re-education;Patient/family education;Manual techniques    PT Next Visit Plan HOLD at pt request with educ on HEP and safe return to gym           Patient will benefit from skilled therapeutic intervention in order to improve the following deficits and impairments:  Abnormal gait,Decreased strength,Impaired sensation,Impaired flexibility,Improper body mechanics,Pain,Difficulty walking,Decreased range of motion  Visit Diagnosis: Acute right-sided low back pain with right-sided sciatica  Muscle weakness (generalized)     Problem List Patient Active Problem List   Diagnosis Date Noted  . Dysesthesia of scalp 06/16/2020  . Midline low back pain without sciatica 06/16/2020  . Chest wall contusion, right,  initial encounter 07/09/2019  . Atypical nevi 06/28/2018  . PCP NOTES >>>>>>>>>>>> 11/29/2016  . CAD (coronary artery disease) 11/29/2016  . High risk sexual behavior 11/23/2015  . Anxiety state 06/17/2015  . Potential exposure to STD 12/16/2014  . Encounter for health maintenance examination with abnormal findings 12/10/2014  . Plantar fasciitis of left foot 12/10/2014  . Colon cancer screening 05/21/2014  . Erectile dysfunction 08/24/2013  . Other malaise and fatigue 08/24/2013  . Essential hypertension, benign 07/20/2013  . Screening for prostate cancer 07/20/2013  . Routine general medical examination at a health care facility 07/20/2013    PAYSEUR,ANGIE PTA 08/11/2020, 8:33 AM  [facility] [address] [place], [place], [address] Phone: [phone]   Fax:  [phone]  Name: Maurice Hernandez MRN: [id number] Date of Birth: 06/01/1961   

## 2020-08-17 ENCOUNTER — Other Ambulatory Visit: Payer: Self-pay | Admitting: Neurology

## 2020-08-17 ENCOUNTER — Ambulatory Visit: Payer: BC Managed Care – PPO | Admitting: Neurology

## 2020-08-17 ENCOUNTER — Encounter: Payer: Self-pay | Admitting: Neurology

## 2020-08-17 ENCOUNTER — Other Ambulatory Visit: Payer: Self-pay

## 2020-08-17 ENCOUNTER — Other Ambulatory Visit: Payer: BC Managed Care – PPO

## 2020-08-17 VITALS — BP 106/72 | HR 51 | Ht 73.0 in | Wt 200.0 lb

## 2020-08-17 DIAGNOSIS — R202 Paresthesia of skin: Secondary | ICD-10-CM

## 2020-08-17 LAB — B12 AND FOLATE PANEL
Folate: >23.6 ng/mL (ref >5.9)
Vitamin B-12: 347 pg/mL (ref 211–911)

## 2020-08-17 LAB — TSH: TSH: 2.58 u[IU]/mL (ref 0.35–4.50)

## 2020-08-17 NOTE — Progress Notes (Signed)
[facility] Neurology Division Clinic Note - Initial Visit   Date: 08/17/20  Maurice Hernandez MRN: [id number] DOB: 1961/08/11   Dear Dr. Doreene Burke:  Thank you for your kind referral of Maurice Hernandez for consultation of dysesthesias. Although his history is well known to you, please allow [place] to reiterate it for the purpose of our medical record. The patient was accompanied to the clinic by self.    History of Present Illness: Maurice Hernandez is a 59 y.o. right-handed male with hyperlipidemia, hypertension, and ED presenting for evaluation of abnormal face sensation.   Starting around October, he was having tight sensation over the right temple region, which is intermittent, lasting a few seconds and the resolves.  It was initially occurring about 4-5 times through the day and more recently has improved.  He denies headache, vision changes, speech changes, or limb weakness.  He had MRI brain which was normal and personally viewed.   He admits to have a lot of recent stressors including a break-up and commuting job.  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   He had right Bell's palsy in 2003 which resolved within 1 month.  He drinks 2-3 beer several times per week.    Out-side paper records, electronic medical record, and images have been reviewed where available and summarized as:  Lab Results  Component Value Date   HGBA1C 5.7 11/24/2016   No results found for: [id number] Lab Results  Component Value Date   TSH 1.91 06/28/2018   MRI brain wwo contrast 08/07/2020:  Normal  MRI lumbar spine wo contrast 08/07/2020: 1. Unchanged severe bilateral L5-S1 neural foraminal stenosis. 2. Progression of mild right and moderate left L4-5 neural foraminal stenosis. 3. Pars interarticularis defects at L5-S1 with grade 1 anterolisthesis.   Past Medical History:  Diagnosis Date  . Bell's palsy 2003  . CAD (coronary artery disease)    NSTEMI 11-15-16  . Hypertension    . Myocardial infarction [facility]) 10/2016    Past Surgical History:  Procedure Laterality Date  . INGUINAL HERNIA REPAIR Left 1968  . INGUINAL HERNIA REPAIR Right 2001  . WISDOM TOOTH EXTRACTION       Medications:  Outpatient Encounter Medications as of 08/17/2020  Medication Sig Note  . aspirin EC 81 MG tablet Take 81 mg by mouth daily.   [name] atorvastatin (LIPITOR) 40 MG tablet Take 40 mg by mouth daily.   [name] lisinopril (PRINIVIL,ZESTRIL) 10 MG tablet Take 10 mg by mouth daily.   . metoprolol tartrate (LOPRESSOR) 25 MG tablet Take 1 tablet (25 mg total) by mouth 2 (two) times daily.   . Multiple Vitamin (MULTI-VITAMINS) TABS Take 1 tablet by mouth daily. 11/24/2016: Not daily  . sildenafil (REVATIO) 20 MG tablet Take one to three tablets daily 45 minutes prior to intercourse as needed   . diazepam (VALIUM) 5 MG tablet Take 1 tablet (5 mg total) by mouth every 12 (twelve) hours as needed for anxiety (take 1 hour prior to procedure. do not drive). (Patient not taking: Reported on 08/17/2020)   . meloxicam (MOBIC) 7.5 MG tablet Take one each day for 10 days and then as needed. (Patient not taking: Reported on 08/17/2020)    No facility-administered encounter medications on file as of 08/17/2020.    Allergies: No Known Allergies  Family History: Family History  Problem Relation Age of Onset  . COPD Mother        Non Smoker (18)   .  Hypertension Mother   . Heart disease Father        CABG (3-4 vessel)   . Hypertension Sister   . Dementia Sister   . Cancer Sister        Hodgkin's Disease (16); Breast Cancer (Bilateral Mastectomy)   . Hypertension Sister   . Alzheimer's disease Paternal Aunt   . Alzheimer's disease Paternal Uncle   . Stomach cancer Maternal Aunt   . Colon cancer Neg Hx   . Esophageal cancer Neg Hx   . Rectal cancer Neg Hx   . Diabetes Neg Hx     Social History: Social History   Tobacco Use  . Smoking status: Never Smoker  . Smokeless tobacco: Never Used   Vaping Use  . Vaping Use: Never used  Substance Use Topics  . Alcohol use: Yes    Alcohol/week: 3.0 standard drinks    Types: 3 Cans of beer per week    Comment: Ocasionally   . 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  Speech is not dysarthric.  CRANIAL NERVES: II:  No visual field defects.  Unremarkable fundi.   III-IV-VI: Pupils equal round and reactive to light.  Normal conjugate, extra-ocular eye movements in all directions of gaze.  No nystagmus.  No ptosis.   V:  Normal facial sensation.    VII:  Normal facial symmetry and movements.  Orbicularis oculi, orbicularis oris, buccinator is 5/5 VIII:  Normal hearing and vestibular function.   IX-X:  Normal palatal movement.   XI:  Normal shoulder shrug and head rotation.   XII:  Normal tongue strength and range of motion, no deviation or fasciculation.  MOTOR: Motor strength is 5/5 throughout. No atrophy, fasciculations or abnormal movements.  No pronator drift.   MSRs:  Right        Left                  brachioradialis 2+  2+  biceps 2+  2+  triceps 2+  2+  patellar 2+  2+  ankle jerk 2+  2+  Hoffman no  no  plantar response down  down   SENSORY:  Normal and symmetric perception of light  touch, pinprick, vibration, and proprioception.   COORDINATION/GAIT: Normal finger-to- nose-finger and heel-to-shin.  Intact rapid alternating movements bilaterally.    Gait narrow based and stable. Tandem and stressed gait intact.    IMPRESSION: Facial dysesthesia, nonspecific and very transient.  Exam and  MRI brain is very reassuring without any worrisome findings.  It is likely that symptoms are stress-induced. To be complete, I will check TSH, vitamin B12, folate, and B1.  Further recommendations pending results.    Thank you for allowing me to participate in patient's care.  If I can answer any additional questions, I would be pleased to do so.    Sincerely,    Vardaan Depascale K. Posey Pronto, DO

## 2020-08-17 NOTE — Patient Instructions (Addendum)
Check labs, results will be posted on MyChart.

## 2020-08-18 LAB — VITAMIN B1

## 2020-08-18 LAB — SPECIMEN STATUS REPORT

## 2020-08-31 ENCOUNTER — Telehealth: Payer: Self-pay | Admitting: Neurology

## 2020-08-31 NOTE — Telephone Encounter (Signed)
Please explain that his labs are normal ranges.  We check vitamin B12 and folate to look for deficiency, which can sometimes cause numbness/tingling.  His labs look great - high folate level is a normal finding and not associated with neurological issues.  With labs being normal, his symptoms are most likely triggered by stress, which we discussed in the office. If he has additional questions, we can set up follow-up visit.

## 2020-08-31 NOTE — Telephone Encounter (Signed)
Pt called as I was calling him to relay the following message from Dr. Posey Pronto.  Please explain that his labs are normal ranges.  We check vitamin B12 and folate to look for deficiency, which can sometimes cause numbness/tingling.  His labs look great - high folate level is a normal finding and not associated with neurological issues.  With labs being normal, his symptoms are most likely triggered by stress, which we discussed in the office. If he has additional questions, we can set up follow-up visit.   He said he felt he needed clarity and was satisfied with the response from Dr. Posey Pronto,  therefore does not need a follow-up appointment for this, nor did he have any additional questions.

## 2020-08-31 NOTE — Telephone Encounter (Signed)
Patient called in and left a message wanting to follow up with someone about his lab results that came back a few weeks ago. His folate panel was "off the charts". He wants to know what all that means?

## 2020-10-01 ENCOUNTER — Ambulatory Visit: Payer: BC Managed Care – PPO | Admitting: Neurology

## 2020-12-20 ENCOUNTER — Other Ambulatory Visit: Payer: Self-pay | Admitting: Family Medicine

## 2020-12-20 DIAGNOSIS — N5201 Erectile dysfunction due to arterial insufficiency: Secondary | ICD-10-CM

## 2021-01-11 DIAGNOSIS — Z85828 Personal history of other malignant neoplasm of skin: Secondary | ICD-10-CM | POA: Diagnosis not present

## 2021-01-11 DIAGNOSIS — D229 Melanocytic nevi, unspecified: Secondary | ICD-10-CM | POA: Diagnosis not present

## 2021-01-11 DIAGNOSIS — L821 Other seborrheic keratosis: Secondary | ICD-10-CM | POA: Diagnosis not present

## 2021-01-11 DIAGNOSIS — D1801 Hemangioma of skin and subcutaneous tissue: Secondary | ICD-10-CM | POA: Diagnosis not present

## 2021-02-03 DIAGNOSIS — B029 Zoster without complications: Secondary | ICD-10-CM

## 2021-02-03 HISTORY — DX: Zoster without complications: B02.9

## 2021-02-18 ENCOUNTER — Encounter: Payer: Self-pay | Admitting: Family Medicine

## 2021-02-18 ENCOUNTER — Other Ambulatory Visit: Payer: Self-pay

## 2021-02-18 ENCOUNTER — Ambulatory Visit (INDEPENDENT_AMBULATORY_CARE_PROVIDER_SITE_OTHER): Payer: Managed Care, Other (non HMO) | Admitting: Family Medicine

## 2021-02-18 VITALS — BP 100/62 | HR 56 | Temp 98.1°F | Ht 73.0 in | Wt 198.2 lb

## 2021-02-18 DIAGNOSIS — L270 Generalized skin eruption due to drugs and medicaments taken internally: Secondary | ICD-10-CM

## 2021-02-18 DIAGNOSIS — B029 Zoster without complications: Secondary | ICD-10-CM

## 2021-02-18 MED ORDER — FAMCICLOVIR 500 MG PO TABS: 500.0000 mg | ORAL_TABLET | Freq: Three times a day (TID) | ORAL | 0 refills | Status: AC

## 2021-02-18 MED ORDER — METHYLPREDNISOLONE 4 MG PO TBPK: ORAL_TABLET | ORAL | 0 refills | Status: DC

## 2021-02-18 NOTE — Progress Notes (Signed)
Established Patient Office Visit  Subjective:  Patient ID: Maurice Hernandez, male    DOB: [birth year]  Age: 60 y.o. MRN: [id number]  CC:  Chief Complaint  Patient presents with   Rash    All over the body    HPI Maurice Hernandez presents for evaluation of a rash on his trunk arms thighs.  The rash is slightly irritating but mostly nonpruritic.  Significant recent history of zoster rash affecting the left side of his face.  He is being treated concurrently with 10 days of Valtrex and a 12-day Dosepak.  He is in the eighth day of his 10-day course of vancyclovir and 10th day of his 12-day Dosepak.  Denies any visual changes.  Denies URI symptoms.  Zoster rashes responding to current treatment.  He has taken prednisone in the past for Bell's palsy  Past Medical History:  Diagnosis Date   Bell's palsy 2003   CAD (coronary artery disease)    NSTEMI 11-15-16   Hypertension    Myocardial infarction ([place]) 10/2016   Shingles 02/03/2021    Past Surgical History:  Procedure Laterality Date   INGUINAL HERNIA REPAIR Left 1968   INGUINAL HERNIA REPAIR Right 2001   WISDOM TOOTH EXTRACTION      Family History  Problem Relation Age of Onset   COPD Mother        Non Smoker (76)    Hypertension Mother    Heart disease Father        CABG (3-4 vessel)    Hypertension Sister    Dementia Sister    Cancer Sister        Hodgkin's Disease (39); Breast Cancer (Bilateral Mastectomy)    Hypertension Sister    Alzheimer's disease Paternal Aunt    Alzheimer's disease Paternal Uncle    Stomach cancer Maternal Aunt    Colon cancer Neg Hx    Esophageal cancer Neg Hx    Rectal cancer Neg Hx    Diabetes Neg Hx     Social History   Socioeconomic History   Marital status: Divorced    Spouse name: Not on file   Number of children: Not on file   Years of education: Not on file   Highest education level: Not on file  Occupational History   Occupation: Doctor, general practice: other - [place]    Comment:  [employer]  Tobacco Use   Smoking status: Never   Smokeless tobacco: Never  Vaping Use   Vaping Use: Never used  Substance and Sexual Activity   Alcohol use: Yes    Alcohol/week: 3.0 standard drinks    Types: 3 Cans of beer per week    Comment: Ocasionally    Drug use: No   Sexual activity: Not Currently  Other Topics Concern   Not on file  Social History Narrative   Marital Status:  Divorced (x 3)    Children:  Son (1)    Pets: Dog (1)    Living Situation: Lives with son Agricultural consultant Custody)     Occupation:  Engineer, 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, sports)    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     atorvastatin (LIPITOR) 40 MG tablet Take 1 tablet by mouth daily.     metoprolol tartrate (LOPRESSOR) 25 MG tablet Take 1 tablet (25 mg total) by mouth 2 (two) times daily. 180 tablet 0   Multiple Vitamin (MULTI-VITAMINS) TABS Take 1 tablet by mouth daily.     sildenafil (REVATIO) 20 MG tablet TAKE 1-3 TABLETS BY MOUTH DAILY 45 MINUTES PRIOR TO INTERCOURSE AS NEEDED 50 tablet 1   atorvastatin (LIPITOR) 40 MG tablet Take 40 mg by mouth daily.     predniSONE (STERAPRED UNI-PAK 48 TAB) 10 MG (48) TBPK tablet Take 1 tablet by mouth once a week.     valACYclovir (VALTREX) 1000 MG tablet Take 1,000 mg by mouth 3 (three) times daily.     lisinopril (PRINIVIL,ZESTRIL) 10 MG tablet Take 10 mg by mouth daily.     diazepam  (VALIUM) 5 MG tablet Take 1 tablet (5 mg total) by mouth every 12 (twelve) hours as needed for anxiety (take 1 hour prior to procedure. do not drive). (Patient not taking: Reported on 02/18/2021) 5 tablet 0   meloxicam (MOBIC) 7.5 MG tablet Take one each day for 10 days and then as needed. (Patient not taking: Reported on 02/18/2021) 30 tablet 0   No facility-administered medications prior to visit.    No Known Allergies  ROS Review of Systems  Constitutional: Negative.   HENT: Negative.    Eyes:  Negative for photophobia and visual disturbance.  Respiratory: Negative.    Cardiovascular: Negative.   Gastrointestinal: Negative.   Skin:  Positive for rash.  Neurological:  Negative for headaches.  Psychiatric/Behavioral: Negative.       Objective:    Physical Exam Constitutional:      General: He is not in acute distress.    Appearance: Normal appearance. He is normal weight. He is not ill-appearing, toxic-appearing or diaphoretic.  HENT:     Head: Normocephalic and atraumatic.     Right Ear: External ear normal.     Left Ear: External ear normal.  Eyes:     General: No scleral icterus.       Right eye: No discharge.        Left eye: No discharge.     Extraocular Movements: Extraocular movements intact.     Conjunctiva/sclera: Conjunctivae normal.     Pupils: Pupils are equal, round, and reactive to light.  Pulmonary:     Effort: Pulmonary effort is normal.  Skin:    General: Skin is warm and dry.       Neurological:     Mental Status: He is alert and oriented to person, place, and time.    BP 100/62   Pulse (!) 56   Temp 98.1 F (36.7 C)   Ht 6\' 1"  (1.854 m)   Wt 198 lb 3.2 oz (89.9 kg)   SpO2 95%   BMI 26.15 kg/m  Wt Readings from Last 3 Encounters:  02/18/21 198 lb 3.2 oz (89.9 kg)  08/17/20 200 lb (90.7 kg)  06/30/20 192 lb 12.8 oz (87.5  kg)     Health Maintenance Due  Topic Date Due   Pneumococcal Vaccine 13-42 Years old (1 - PCV) Never done   Zoster  Vaccines- Shingrix (1 of 2) Never done   COVID-19 Vaccine (3 - Pfizer risk series) 12/27/2019    There are no preventive care reminders to display for this patient.  Lab Results  Component Value Date   TSH 2.58 08/17/2020   Lab Results  Component Value Date   WBC 4.5 12/10/2019   HGB 14.3 12/10/2019   HCT 41.9 12/10/2019   MCV 93.2 12/10/2019   PLT 187.0 12/10/2019   Lab Results  Component Value Date   NA 137 12/10/2019   K 4.1 12/10/2019   CO2 31 12/10/2019   GLUCOSE 90 12/10/2019   BUN 18 12/10/2019   CREATININE 1.07 12/10/2019   BILITOT 1.1 12/10/2019   ALKPHOS 69 12/10/2019   AST 31 12/10/2019   ALT 30 12/10/2019   PROT 6.7 12/10/2019   ALBUMIN 4.6 12/10/2019   CALCIUM 9.5 12/10/2019   GFR 71.33 12/10/2019   Lab Results  Component Value Date   CHOL 106 12/10/2019   Lab Results  Component Value Date   HDL 39.70 12/10/2019   Lab Results  Component Value Date   LDLCALC 50 12/10/2019   Lab Results  Component Value Date   TRIG 81.0 12/10/2019   Lab Results  Component Value Date   CHOLHDL 3 12/10/2019   Lab Results  Component Value Date   HGBA1C 5.7 11/24/2016      Assessment & Plan:   Problem List Items Addressed This Visit   None Visit Diagnoses     Herpes zoster without complication    -  Primary   Relevant Medications   methylPREDNISolone (MEDROL DOSEPAK) 4 MG TBPK tablet   famciclovir (FAMVIR) 500 MG tablet   Drug rash       Relevant Medications   methylPREDNISolone (MEDROL DOSEPAK) 4 MG TBPK tablet       Meds ordered this encounter  Medications   methylPREDNISolone (MEDROL DOSEPAK) 4 MG TBPK tablet    Sig: Take 6 today, 5 tomorrow, 4 the next day and then 3, 2, 1 and stop    Dispense:  21 tablet    Refill:  0   famciclovir (FAMVIR) 500 MG tablet    Sig: Take 1 tablet (500 mg total) by mouth 3 (three) times daily for 3 days.    Dispense:  9 tablet    Refill:  0    Follow-up: Return follow up on wednesday.  Patient will stop  the prednisone and Valtrex.  He will start Medrol Dosepak and Famvir.  Follow-up on Wednesday for scheduled appointment.  Given information on Famvir, methylprednisolone and zoster.  Recommend Shingrix vaccine in 6 months  Libby Maw, MD

## 2021-02-24 ENCOUNTER — Encounter: Payer: Self-pay | Admitting: Family Medicine

## 2021-02-24 ENCOUNTER — Other Ambulatory Visit (HOSPITAL_COMMUNITY)
Admission: RE | Admit: 2021-02-24 | Discharge: 2021-02-24 | Disposition: A | Discharge status: Home / Self Care | Payer: Managed Care, Other (non HMO) | Source: Ambulatory Visit | Attending: Family Medicine | Admitting: Family Medicine

## 2021-02-24 ENCOUNTER — Ambulatory Visit (INDEPENDENT_AMBULATORY_CARE_PROVIDER_SITE_OTHER): Payer: Managed Care, Other (non HMO) | Admitting: Family Medicine

## 2021-02-24 ENCOUNTER — Other Ambulatory Visit: Payer: Self-pay

## 2021-02-24 VITALS — BP 120/82 | HR 47 | Temp 97.5°F | Ht 73.0 in | Wt 196.2 lb

## 2021-02-24 DIAGNOSIS — Z202 Contact with and (suspected) exposure to infections with a predominantly sexual mode of transmission: Secondary | ICD-10-CM

## 2021-02-24 DIAGNOSIS — L270 Generalized skin eruption due to drugs and medicaments taken internally: Secondary | ICD-10-CM | POA: Diagnosis not present

## 2021-02-24 DIAGNOSIS — B029 Zoster without complications: Secondary | ICD-10-CM

## 2021-02-24 DIAGNOSIS — Z0000 Encounter for general adult medical examination without abnormal findings: Secondary | ICD-10-CM | POA: Diagnosis not present

## 2021-02-24 DIAGNOSIS — I2510 Atherosclerotic heart disease of native coronary artery without angina pectoris: Secondary | ICD-10-CM | POA: Diagnosis not present

## 2021-02-24 LAB — COMPREHENSIVE METABOLIC PANEL
ALT: 26 U/L (ref 0–53)
AST: 18 U/L (ref 0–37)
Albumin: 3.9 g/dL (ref 3.5–5.2)
Alkaline Phosphatase: 54 U/L (ref 39–117)
BUN: 32 mg/dL — ABNORMAL HIGH (ref 6–23)
CO2: 27 mEq/L (ref 19–32)
Calcium: 8.9 mg/dL (ref 8.4–10.5)
Chloride: 101 mEq/L (ref 96–112)
Creatinine, Ser: 1.06 mg/dL (ref 0.40–1.50)
GFR: 77.73 mL/min (ref >60.00)
Glucose, Bld: 76 mg/dL (ref 70–99)
Potassium: 4.5 mEq/L (ref 3.5–5.1)
Sodium: 134 mEq/L — ABNORMAL LOW (ref 135–145)
Total Bilirubin: 1.2 mg/dL (ref 0.2–1.2)
Total Protein: 6.2 g/dL (ref 6.0–8.3)

## 2021-02-24 LAB — URINALYSIS, ROUTINE W REFLEX MICROSCOPIC
Bilirubin Urine: NEGATIVE
Hgb urine dipstick: NEGATIVE
Ketones, ur: NEGATIVE
Leukocytes,Ua: NEGATIVE
Nitrite: NEGATIVE
RBC / HPF: NONE SEEN (ref 0–?)
Specific Gravity, Urine: >=1.03 — AB (ref 1.000–1.030)
Total Protein, Urine: NEGATIVE
Urine Glucose: NEGATIVE
Urobilinogen, UA: 0.2 (ref 0.0–1.0)
pH: 6 (ref 5.0–8.0)

## 2021-02-24 LAB — CBC
HCT: 43.8 % (ref 39.0–52.0)
Hemoglobin: 15.1 g/dL (ref 13.0–17.0)
MCHC: 34.5 g/dL (ref 30.0–36.0)
MCV: 94.1 fl (ref 78.0–100.0)
Platelets: 176 10*3/uL (ref 150.0–400.0)
RBC: 4.66 Mil/uL (ref 4.22–5.81)
RDW: 13.8 % (ref 11.5–15.5)
WBC: 7.7 10*3/uL (ref 4.0–10.5)

## 2021-02-24 LAB — LIPID PANEL
Cholesterol: 136 mg/dL (ref 0–200)
HDL: 51.5 mg/dL (ref >39.00)
LDL Cholesterol: 68 mg/dL (ref 0–99)
NonHDL: 84.76
Total CHOL/HDL Ratio: 3
Triglycerides: 83 mg/dL (ref 0.0–149.0)
VLDL: 16.6 mg/dL (ref 0.0–40.0)

## 2021-02-24 NOTE — Progress Notes (Signed)
Established Patient Office Visit  Subjective:  Patient ID: Maurice Hernandez, male    DOB: 09/14/1960  Age: 60 y.o. MRN: [id number]  CC:  Chief Complaint  Patient presents with   Herpes Zoster    Follow up on shingles all over body. No concerns.     HPI Maurice Hernandez presents for follow-up of zoster infection and associated drug rash.  Zoster is mostly cleared with 3 more days of famciclovir.  Patient did well with a dexamethasone taper but would rather not take another.  Rash on trunk and arms has become more confluent but is fixed.  It is mildly irritating.  There is some pruritus.  Overall he feels better.  Also follow-up today for physical.  He continues to exercise and lead a healthy lifestyle.  He has no chest pain or shortness of breath.  He has no claudication pain when he walks on a daily basis for up to 5 miles.  Does not smoke or use illicit drugs.  Continues follow-up with cardiology.  Past Medical History:  Diagnosis Date   Bell's palsy 2003   CAD (coronary artery disease)    NSTEMI 11-15-16   Hypertension    Myocardial infarction ([place]) 10/2016   Shingles 02/03/2021    Past Surgical History:  Procedure Laterality Date   INGUINAL HERNIA REPAIR Left 1968   INGUINAL HERNIA REPAIR Right 2001   WISDOM TOOTH EXTRACTION      Family History  Problem Relation Age of Onset   COPD Mother        Non Smoker (45)    Hypertension Mother    Heart disease Father        CABG (3-4 vessel)    Hypertension Sister    Dementia Sister    Cancer Sister        Hodgkin's Disease (76); Breast Cancer (Bilateral Mastectomy)    Hypertension Sister    Alzheimer's disease Paternal Aunt    Alzheimer's disease Paternal Uncle    Stomach cancer Maternal Aunt    Colon cancer Neg Hx    Esophageal cancer Neg Hx    Rectal cancer Neg Hx    Diabetes Neg Hx     Social History   Socioeconomic History   Marital status: Divorced    Spouse name: Not on file   Number of children: Not on file    Years of education: Not on file   Highest education level: Not on file  Occupational History   Occupation: Doctor, general practice: other - [employer]    Comment: [employer]  Tobacco Use   Smoking status: Never   Smokeless tobacco: Never  Vaping Use   Vaping Use: Never used  Substance and Sexual Activity   Alcohol use: Yes    Alcohol/week: 3.0 standard drinks    Types: 3 Cans of beer per week    Comment: Ocasionally    Drug use: No   Sexual activity: Not Currently  Other Topics Concern   Not on file  Social History Narrative   Marital Status:  Divorced (x 3)    Children:  Son (1)    Pets: Dog (1)    Living Situation: Lives with son Agricultural consultant Custody)     Occupation:  Engineer, 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, sports)    Education:  [employer] Civil engineer, contracting)     Tobacco Use:  None    Alcohol Use:  2-3 x per week    Drug Use:  None  Diet:  Regular   Exercise:  Running (3 x per week)    Hobbies:  Traveling, Sports     Right Handed      Social Determinants of Health   Financial Resource Strain: Not on file  Food Insecurity: Not on file  Transportation Needs: Not on file  Physical Activity: Not on file  Stress: Not on file  Social Connections: Not on file  Intimate Partner Violence: Not on file    Outpatient Medications Prior to Visit  Medication Sig Dispense Refill   aspirin EC 81 MG tablet Take 81 mg by mouth daily.     atorvastatin (LIPITOR) 40 MG tablet Take 1 tablet by mouth daily.     methylPREDNISolone (MEDROL DOSEPAK) 4 MG TBPK tablet Take 6 today, 5 tomorrow, 4 the next day and then 3, 2, 1 and stop 21 tablet 0   metoprolol tartrate (LOPRESSOR) 25 MG tablet Take 1 tablet (25 mg total) by mouth 2 (two) times daily. 180 tablet 0   Multiple Vitamin (MULTI-VITAMINS) TABS Take 1 tablet by mouth daily.     sildenafil (REVATIO) 20 MG tablet TAKE 1-3 TABLETS BY MOUTH DAILY 45 MINUTES PRIOR TO INTERCOURSE AS NEEDED 50 tablet 1   lisinopril (PRINIVIL,ZESTRIL) 10 MG tablet Take 10 mg  by mouth daily.     No facility-administered medications prior to visit.    No Known Allergies  ROS Review of Systems  Constitutional: Negative.   HENT: Negative.    Eyes:  Negative for photophobia and visual disturbance.  Respiratory: Negative.    Cardiovascular: Negative.   Gastrointestinal: Negative.   Genitourinary: Negative.   Musculoskeletal: Negative.   Skin:  Positive for color change and rash.  Allergic/Immunologic: Negative for immunocompromised state.  Neurological:  Negative for speech difficulty and weakness.  Psychiatric/Behavioral: Negative.       Objective:    Physical Exam Vitals and nursing note reviewed.  Constitutional:      General: He is not in acute distress.    Appearance: Normal appearance. He is normal weight. He is not ill-appearing, toxic-appearing or diaphoretic.  Cardiovascular:     Rate and Rhythm: Normal rate and regular rhythm.     Pulses:          Dorsalis pedis pulses are 2+ on the right side and 1+ on the left side.       Posterior tibial pulses are 1+ on the right side and 1+ on the left side.  Pulmonary:     Effort: Pulmonary effort is normal.     Breath sounds: Normal breath sounds.  Abdominal:     General: Bowel sounds are normal.  Musculoskeletal:     Cervical back: No rigidity or tenderness.  Lymphadenopathy:     Cervical: No cervical adenopathy.  Skin:      Neurological:     Mental Status: He is alert and oriented to person, place, and time.  Psychiatric:        Mood and Affect: Mood normal.        Behavior: Behavior normal.    BP 120/82   Pulse (!) 47   Temp (!) 97.5 F (36.4 C) (Temporal)   Ht 6\' 1"  (1.854 m)   Wt 196 lb 3.2 oz (89 kg)   SpO2 98%   BMI 25.89 kg/m  Wt Readings from Last 3 Encounters:  02/24/21 196 lb 3.2 oz (89 kg)  02/18/21 198 lb 3.2 oz (89.9 kg)  08/17/20 200 lb (90.7 kg)  Health Maintenance Due  Topic Date Due   Pneumococcal Vaccine 2-23 Years old (1 - PCV) Never done   Zoster  Vaccines- Shingrix (1 of 2) Never done    There are no preventive care reminders to display for this patient.  Lab Results  Component Value Date   TSH 2.58 08/17/2020   Lab Results  Component Value Date   WBC 4.5 12/10/2019   HGB 14.3 12/10/2019   HCT 41.9 12/10/2019   MCV 93.2 12/10/2019   PLT 187.0 12/10/2019   Lab Results  Component Value Date   NA 137 12/10/2019   K 4.1 12/10/2019   CO2 31 12/10/2019   GLUCOSE 90 12/10/2019   BUN 18 12/10/2019   CREATININE 1.07 12/10/2019   BILITOT 1.1 12/10/2019   ALKPHOS 69 12/10/2019   AST 31 12/10/2019   ALT 30 12/10/2019   PROT 6.7 12/10/2019   ALBUMIN 4.6 12/10/2019   CALCIUM 9.5 12/10/2019   GFR 71.33 12/10/2019   Lab Results  Component Value Date   CHOL 106 12/10/2019   Lab Results  Component Value Date   HDL 39.70 12/10/2019   Lab Results  Component Value Date   LDLCALC 50 12/10/2019   Lab Results  Component Value Date   TRIG 81.0 12/10/2019   Lab Results  Component Value Date   CHOLHDL 3 12/10/2019   Lab Results  Component Value Date   HGBA1C 5.7 11/24/2016      Assessment & Plan:   Problem List Items Addressed This Visit       Cardiovascular and Mediastinum   CAD (coronary artery disease)   Relevant Orders   CBC   Lipid panel     Other   Potential exposure to STD   Relevant Orders   HIV Antibody (routine testing w rflx)   Urinalysis, Routine w reflex microscopic   Urine cytology ancillary only   Other Visit Diagnoses     Herpes zoster without complication    -  Primary   Drug rash       Healthcare maintenance       Relevant Orders   CBC   Comprehensive metabolic panel       No orders of the defined types were placed in this encounter.   Follow-up: Return in about 6 months (around 08/27/2021), or if symptoms worsen or fail to improve.  Continue all medicines as above.  Given information on health maintenance and disease prevention as well as information on coronary artery  disease.  Anticipate the rash will continue to resolve.  If it does not he will return.  Advised Shingrix vaccine in 6 months.  Libby Maw, MD

## 2021-02-25 LAB — URINE CYTOLOGY ANCILLARY ONLY
Chlamydia: NEGATIVE
Comment: NEGATIVE

## 2021-02-25 LAB — HIV ANTIBODY (ROUTINE TESTING W REFLEX): HIV 1&2 Ab, 4th Generation: NONREACTIVE

## 2021-03-31 ENCOUNTER — Telehealth: Payer: Self-pay | Admitting: Family Medicine

## 2021-03-31 NOTE — Telephone Encounter (Signed)
Patient needs virtual visit to be evaluated for symptoms.

## 2021-03-31 NOTE — Telephone Encounter (Signed)
Pt called and said he has a sore throat and I recommended a virtual visit with someone at the [facility] location and he said hes just going to go to the drug store and get a rapid test there

## 2021-03-31 NOTE — Telephone Encounter (Signed)
FYI:He has tested positive for covid. I offered an appointment, he declined.

## 2021-04-01 NOTE — Telephone Encounter (Signed)
I called pt and left him a voice mail  to give [place] a call back to schedule a virtual for him.

## 2021-04-02 NOTE — Telephone Encounter (Signed)
I called pt to check on him to see if he wanted me to schedule a virtual. He says he's doing better today. He even took a 3 mile walk today. He wants to ride it out.

## 2021-04-06 ENCOUNTER — Telehealth: Payer: Self-pay

## 2021-04-06 NOTE — Telephone Encounter (Signed)
Pts had positive Covid test 6 days ago. He is feeling a lot better, returned to work but has nasal congestion. He would like to know what he can take to ease the congestion.  His call back # is [phone]  Thank you

## 2021-04-06 NOTE — Telephone Encounter (Signed)
Please advise message below. Patient would like to know if there was something he could help with nasal congestion. Positive for coivd 1 week ago. Back at work now with nasal issues.

## 2021-04-06 NOTE — Telephone Encounter (Signed)
Patient aware of message below.

## 2021-12-15 IMAGING — DX DG LUMBAR SPINE COMPLETE 4+V
5 series · 5 of 5 positions shown · non-contrast
Comparison: 09/11/2008 MRI

CLINICAL DATA: Low back pain

EXAM:
LUMBAR SPINE - COMPLETE 4+ VIEW

[lumbar spine ap]
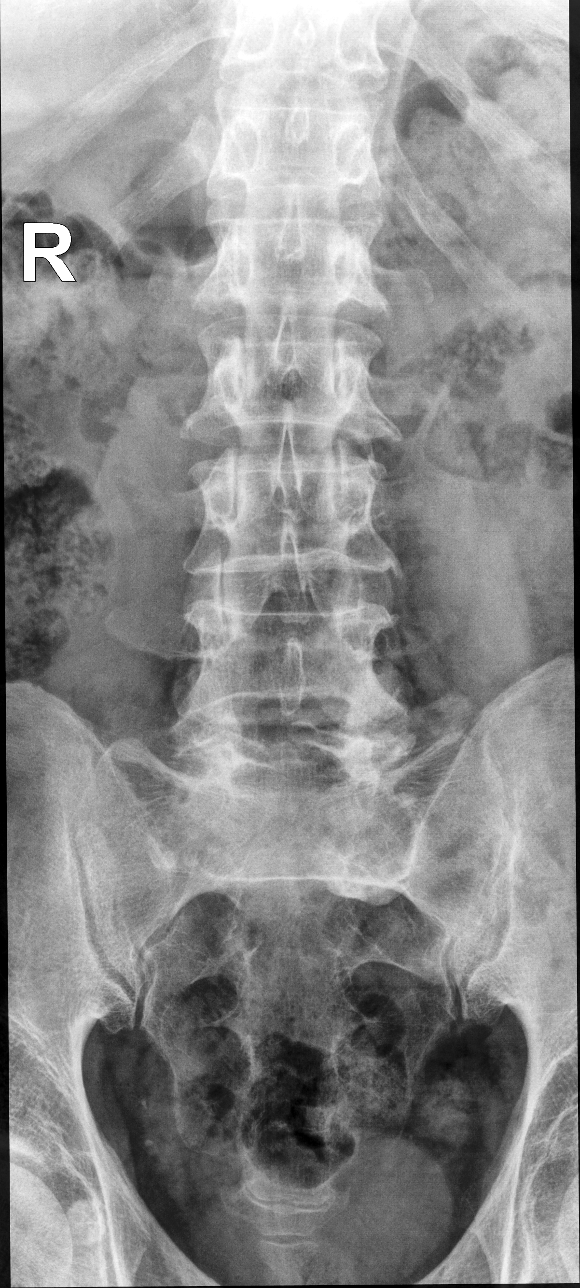

[lumbar spine lmo (1 of 2)]
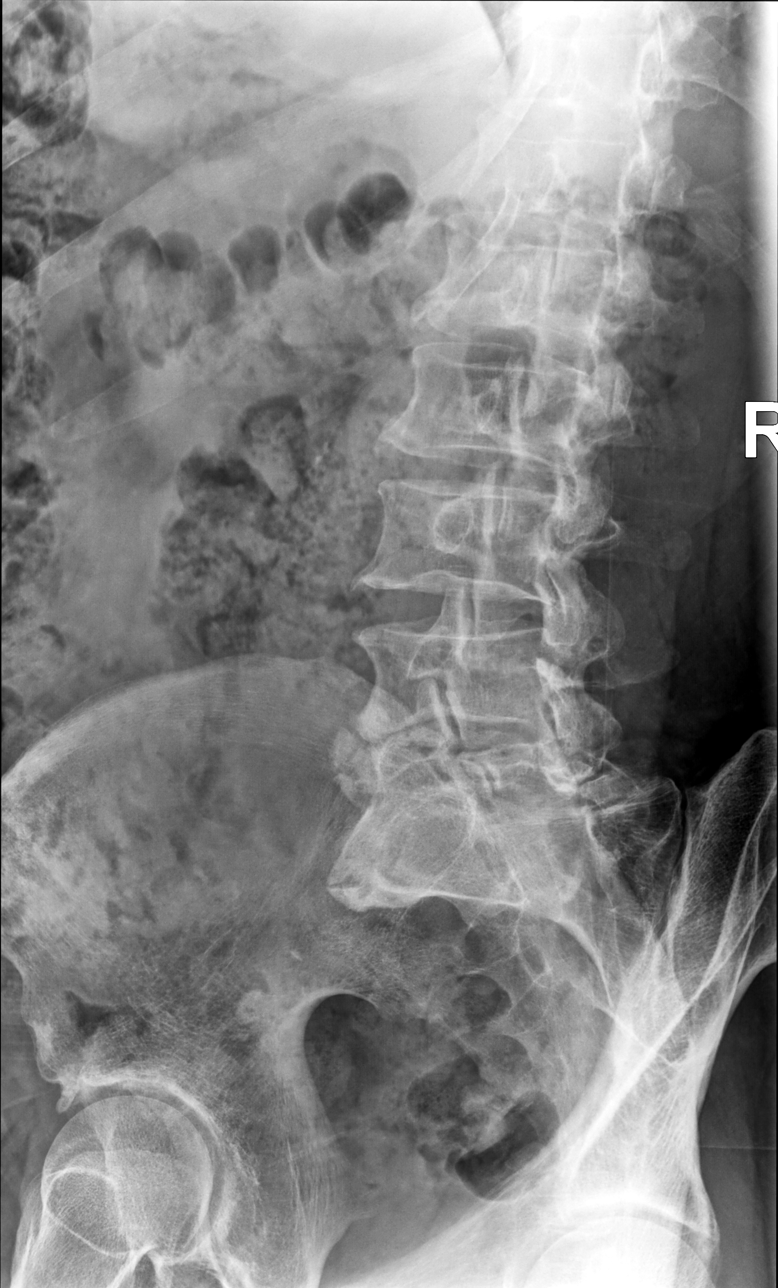

[lumbar spine lmo (2 of 2)]
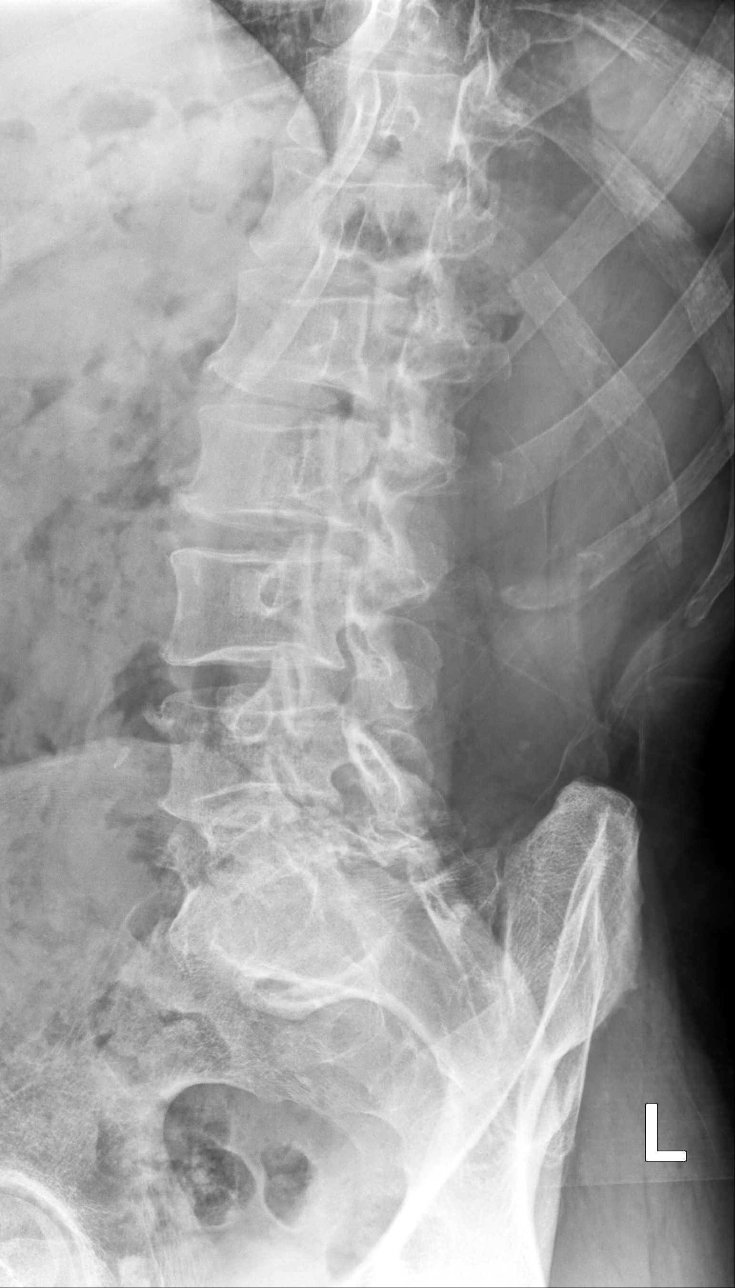

[lumbar spine lat (1 of 2)]
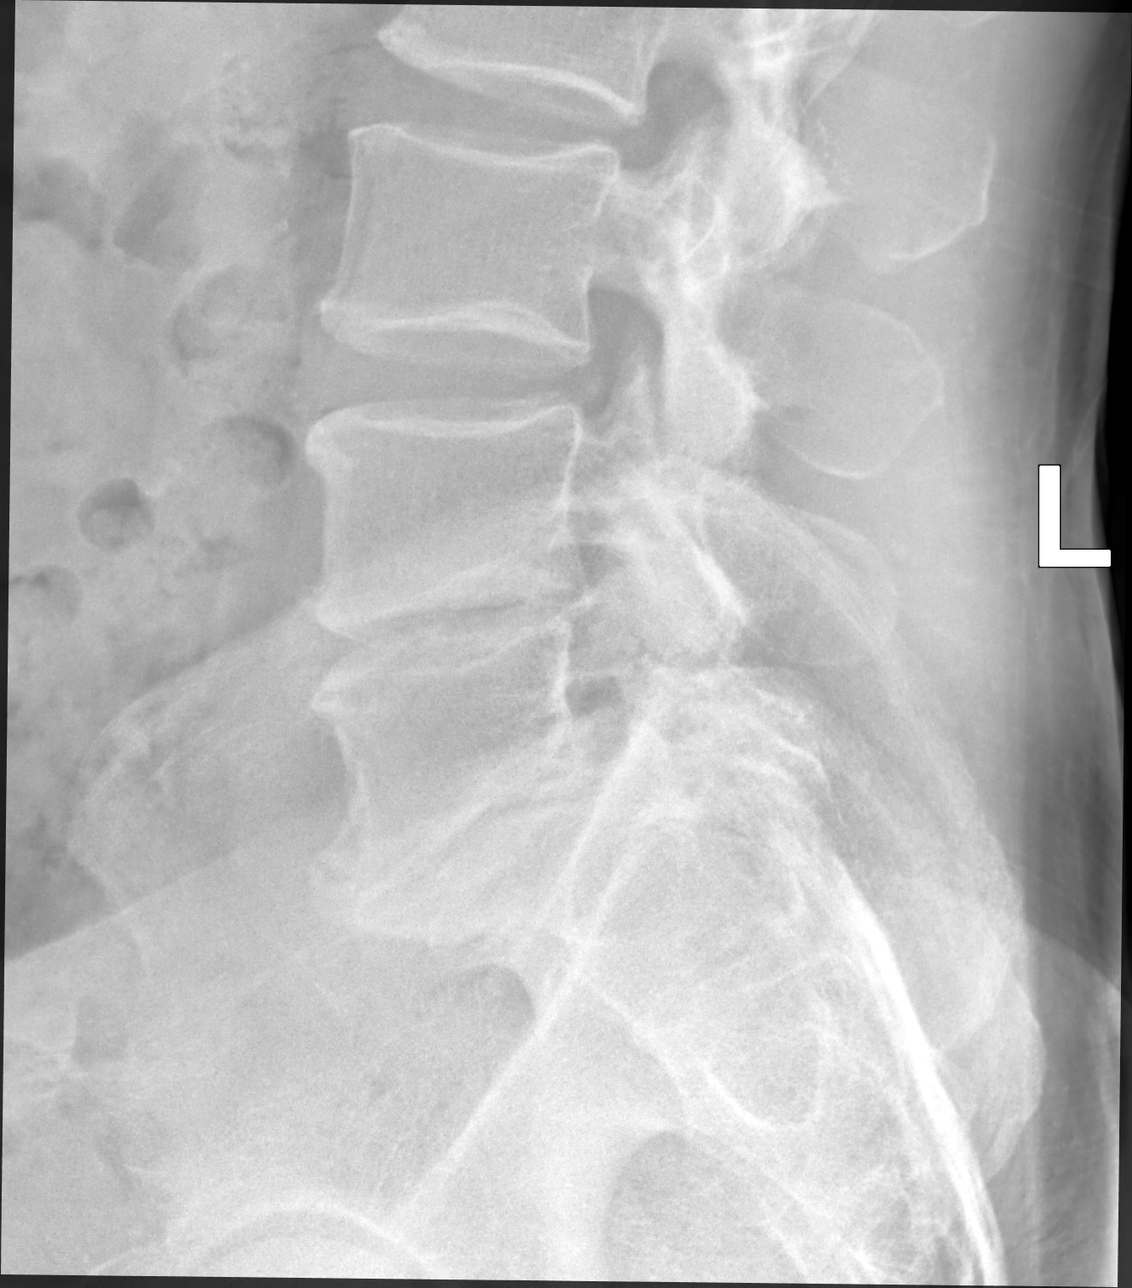

[lumbar spine lat (2 of 2)]
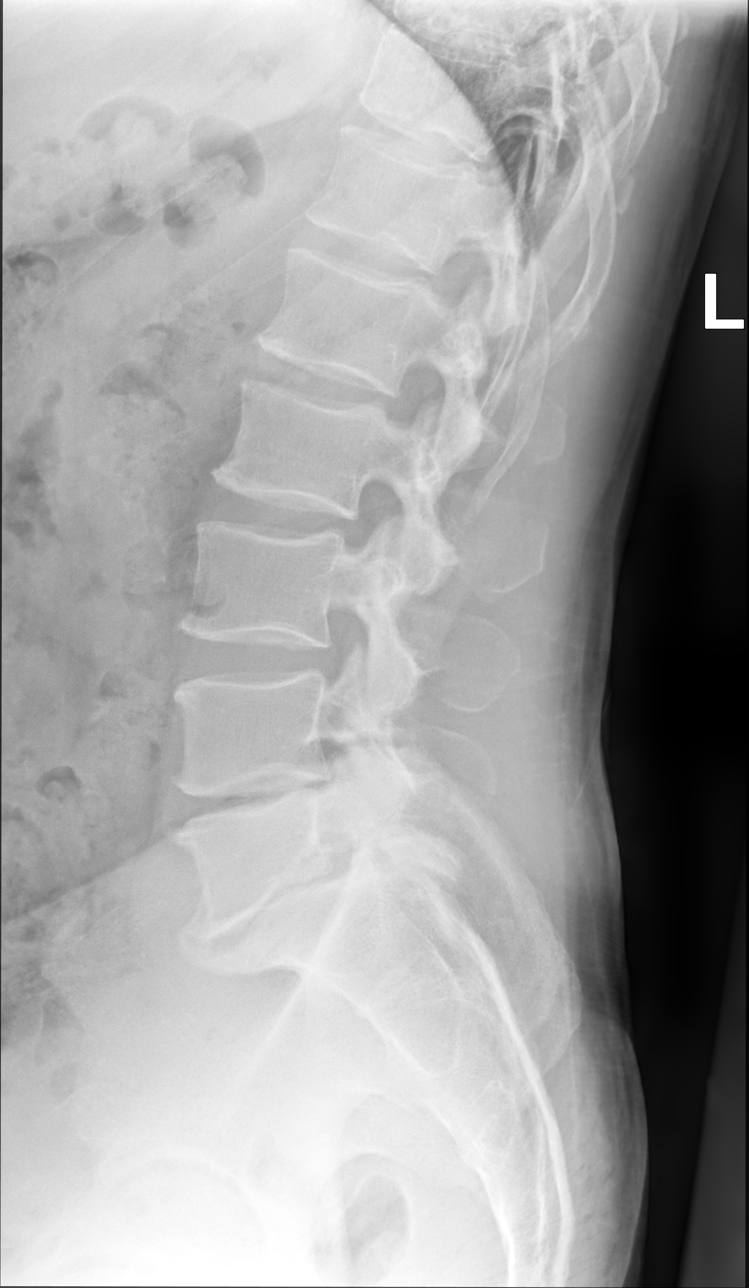

[5 of 5 positions shown; findings below may reference images not displayed]

FINDINGS: Alignment demonstrates similar grade 1 anterolisthesis of L5 upon S1
with advanced degenerative disc disease. This appears secondary to
L5 pars defects. There is disc space narrowing, sclerosis and
endplate osteophytes at L4-5 and L5-S1. Preserved vertebral body
heights. No acute compression fracture, wedge-shaped deformity or
focal kyphosis.

Normal SI joints. Normal appearing pedicles. Nonobstructive bowel
gas pattern.
IMPRESSION: Advanced lower lumbar degenerative disc disease. Associated L5 pars
defects and grade 1 anterolisthesis of L5 upon S1 appearing chronic.

No acute compression fracture.

## 2022-01-14 ENCOUNTER — Encounter: Payer: Self-pay | Admitting: Family Medicine

## 2022-07-01 DIAGNOSIS — B3749 Other urogenital candidiasis: Secondary | ICD-10-CM | POA: Diagnosis not present

## 2022-07-02 DIAGNOSIS — B3749 Other urogenital candidiasis: Secondary | ICD-10-CM | POA: Diagnosis not present

## 2022-07-20 DIAGNOSIS — L249 Irritant contact dermatitis, unspecified cause: Secondary | ICD-10-CM | POA: Diagnosis not present

## 2022-07-20 DIAGNOSIS — E663 Overweight: Secondary | ICD-10-CM | POA: Diagnosis not present

## 2022-08-01 DIAGNOSIS — L308 Other specified dermatitis: Secondary | ICD-10-CM | POA: Diagnosis not present

## 2022-08-01 DIAGNOSIS — E663 Overweight: Secondary | ICD-10-CM | POA: Diagnosis not present

## 2022-08-05 DIAGNOSIS — K148 Other diseases of tongue: Secondary | ICD-10-CM | POA: Diagnosis not present

## 2022-08-26 DIAGNOSIS — K5900 Constipation, unspecified: Secondary | ICD-10-CM | POA: Diagnosis not present

## 2022-09-01 ENCOUNTER — Ambulatory Visit (INDEPENDENT_AMBULATORY_CARE_PROVIDER_SITE_OTHER): Payer: 59

## 2022-09-01 DIAGNOSIS — Z23 Encounter for immunization: Secondary | ICD-10-CM | POA: Diagnosis not present

## 2022-09-07 ENCOUNTER — Encounter: Payer: Self-pay | Admitting: Family Medicine

## 2022-10-05 DIAGNOSIS — I2510 Atherosclerotic heart disease of native coronary artery without angina pectoris: Secondary | ICD-10-CM | POA: Diagnosis not present

## 2022-10-05 DIAGNOSIS — E782 Mixed hyperlipidemia: Secondary | ICD-10-CM | POA: Diagnosis not present

## 2022-10-05 DIAGNOSIS — I10 Essential (primary) hypertension: Secondary | ICD-10-CM | POA: Diagnosis not present

## 2022-12-01 DIAGNOSIS — L820 Inflamed seborrheic keratosis: Secondary | ICD-10-CM | POA: Diagnosis not present

## 2023-01-19 DIAGNOSIS — C44619 Basal cell carcinoma of skin of left upper limb, including shoulder: Secondary | ICD-10-CM | POA: Diagnosis not present

## 2023-01-19 DIAGNOSIS — D229 Melanocytic nevi, unspecified: Secondary | ICD-10-CM | POA: Diagnosis not present

## 2023-01-19 DIAGNOSIS — D485 Neoplasm of uncertain behavior of skin: Secondary | ICD-10-CM | POA: Diagnosis not present

## 2023-01-19 DIAGNOSIS — Z85828 Personal history of other malignant neoplasm of skin: Secondary | ICD-10-CM | POA: Diagnosis not present

## 2023-01-19 DIAGNOSIS — L821 Other seborrheic keratosis: Secondary | ICD-10-CM | POA: Diagnosis not present

## 2023-02-22 DIAGNOSIS — C44619 Basal cell carcinoma of skin of left upper limb, including shoulder: Secondary | ICD-10-CM | POA: Diagnosis not present

## 2023-02-24 ENCOUNTER — Encounter: Payer: Self-pay | Admitting: Internal Medicine

## 2023-03-29 ENCOUNTER — Encounter: Payer: Self-pay | Admitting: Internal Medicine

## 2023-04-03 ENCOUNTER — Encounter: Payer: Self-pay | Admitting: Family Medicine

## 2023-04-03 ENCOUNTER — Ambulatory Visit: Payer: 59 | Admitting: Family Medicine

## 2023-04-03 ENCOUNTER — Ambulatory Visit (INDEPENDENT_AMBULATORY_CARE_PROVIDER_SITE_OTHER)
Admission: RE | Admit: 2023-04-03 | Discharge: 2023-04-03 | Disposition: A | Discharge status: Home / Self Care | Payer: 59 | Source: Ambulatory Visit | Attending: Family Medicine | Admitting: Family Medicine

## 2023-04-03 VITALS — BP 104/70 | HR 52 | Temp 97.9°F | Ht 73.0 in | Wt 201.4 lb

## 2023-04-03 DIAGNOSIS — S90121A Contusion of right lesser toe(s) without damage to nail, initial encounter: Secondary | ICD-10-CM | POA: Diagnosis not present

## 2023-04-03 DIAGNOSIS — S90221A Contusion of right lesser toe(s) with damage to nail, initial encounter: Secondary | ICD-10-CM

## 2023-04-03 DIAGNOSIS — Z23 Encounter for immunization: Secondary | ICD-10-CM

## 2023-04-03 NOTE — Progress Notes (Signed)
Established Patient Office Visit   Subjective:  Patient ID: Maurice Hernandez, male    DOB: Dec 26, 1960  Age: 62 y.o. MRN: [id number]  Chief Complaint  Patient presents with   Toe Injury    Right 4th toe injury x 1 day. Pt hit toe om wooden bed post. Edrma, redness.     HPI Encounter Diagnoses  Name Primary?   Contusion of lesser toe of right foot with damage to nail, initial encounter Yes   Need for shingles vaccine    Struck right fourth toe on a bedpost 2 days ago.  There is now bruising and mild soreness.  No tenderness to palpation.  Due for second Shingrix.  Doing well.   Review of Systems  Constitutional: Negative.   HENT: Negative.    Eyes:  Negative for blurred vision, discharge and redness.  Respiratory: Negative.    Cardiovascular: Negative.   Gastrointestinal:  Negative for abdominal pain.  Genitourinary: Negative.   Musculoskeletal: Negative.  Negative for myalgias.  Skin:  Negative for rash.  Neurological:  Negative for tingling, loss of consciousness and weakness.  Endo/Heme/Allergies:  Negative for polydipsia.     Current Outpatient Medications:    aspirin EC 81 MG tablet, Take 81 mg by mouth daily., Disp: , Rfl:    atorvastatin (LIPITOR) 40 MG tablet, Take 1 tablet by mouth daily., Disp: , Rfl:    metoprolol tartrate (LOPRESSOR) 25 MG tablet, Take 1 tablet (25 mg total) by mouth 2 (two) times daily., Disp: 180 tablet, Rfl: 0   Multiple Vitamin (MULTI-VITAMINS) TABS, Take 1 tablet by mouth daily., Disp: , Rfl:    sildenafil (REVATIO) 20 MG tablet, TAKE 1-3 TABLETS BY MOUTH DAILY 45 MINUTES PRIOR TO INTERCOURSE AS NEEDED, Disp: 50 tablet, Rfl: 1   lisinopril (PRINIVIL,ZESTRIL) 10 MG tablet, Take 10 mg by mouth daily., Disp: , Rfl:    methylPREDNISolone (MEDROL DOSEPAK) 4 MG TBPK tablet, Take 6 today, 5 tomorrow, 4 the next day and then 3, 2, 1 and stop (Patient not taking: Reported on 04/03/2023), Disp: 21 tablet, Rfl: 0   Objective:     BP 104/70   Pulse  (!) 52   Temp 97.9 F (36.6 C)   Ht 6\' 1"  (1.854 m)   Wt 201 lb 6.4 oz (91.4 kg)   SpO2 98%   BMI 26.57 kg/m    Physical Exam Constitutional:      General: He is not in acute distress.    Appearance: Normal appearance. He is not ill-appearing, toxic-appearing or diaphoretic.  HENT:     Head: Normocephalic and atraumatic.     Right Ear: External ear normal.     Left Ear: External ear normal.  Eyes:     General: No scleral icterus.       Right eye: No discharge.        Left eye: No discharge.     Extraocular Movements: Extraocular movements intact.     Conjunctiva/sclera: Conjunctivae normal.  Pulmonary:     Effort: Pulmonary effort is normal. No respiratory distress.  Musculoskeletal:     Right foot: Normal pulse.     Comments: Right fourth toe with swelling and ecchymosis.  Minimal tenderness to palpation.  Skin:    General: Skin is warm and dry.  Neurological:     Mental Status: He is alert and oriented to person, place, and time.  Psychiatric:        Mood and Affect: Mood normal.  Behavior: Behavior normal.      No results found for any visits on 04/03/23.    The ASCVD Risk score (Arnett DK, et al., 2019) failed to calculate for the following reasons:   The patient has a prior MI or stroke diagnosis    Assessment & Plan:   Contusion of lesser toe of right foot with damage to nail, initial encounter -     DG Foot Complete Right; Future  Need for shingles vaccine -     Varicella-zoster vaccine subcutaneous    Return Buddy tape and firm soled shoes for comfort..  Will return for physical later this fall.  Mliss Sax, MD

## 2023-05-10 ENCOUNTER — Ambulatory Visit (AMBULATORY_SURGERY_CENTER): Payer: 59

## 2023-05-10 VITALS — Ht 73.0 in | Wt 198.0 lb

## 2023-05-10 DIAGNOSIS — Z86010 Personal history of colonic polyps: Secondary | ICD-10-CM

## 2023-05-10 MED ORDER — NA SULFATE-K SULFATE-MG SULF 17.5-3.13-1.6 GM/177ML PO SOLN
1.0000 | Freq: Once | ORAL | 0 refills | Status: AC
Start: 2023-05-23 — End: 2023-05-23

## 2023-05-10 NOTE — Progress Notes (Signed)
No egg or soy allergy known to patient  No issues known to pt with past sedation with any surgeries or procedures Patient denies ever being told they had issues or difficulty with intubation  No FH of Malignant Hyperthermia Pt is not on diet pills Pt is not on  home 02  Pt is not on blood thinners  Pt denies issues with constipation  No A fib or A flutter Have any cardiac testing pending--no LOA: independent  Prep: suprep   Patient's chart reviewed by Maurice Hernandez CNRA prior to previsit and patient appropriate for the [facility].  Previsit completed and red dot placed by patient's name on their procedure day (on provider's schedule).     PV competed with patient. Prep instructions sent via mychart and home address. Goodrx coupon for [facility] provided to use for price reduction if needed.

## 2023-05-26 ENCOUNTER — Encounter: Payer: Self-pay | Admitting: Internal Medicine

## 2023-06-07 ENCOUNTER — Ambulatory Visit (AMBULATORY_SURGERY_CENTER): Payer: 59 | Admitting: Internal Medicine

## 2023-06-07 ENCOUNTER — Encounter: Payer: Self-pay | Admitting: Internal Medicine

## 2023-06-07 VITALS — BP 104/66 | HR 56 | Temp 96.9°F | Resp 11 | Ht 73.0 in | Wt 192.0 lb

## 2023-06-07 DIAGNOSIS — Z860100 Personal history of colon polyps, unspecified: Secondary | ICD-10-CM

## 2023-06-07 DIAGNOSIS — I252 Old myocardial infarction: Secondary | ICD-10-CM | POA: Diagnosis not present

## 2023-06-07 DIAGNOSIS — I2510 Atherosclerotic heart disease of native coronary artery without angina pectoris: Secondary | ICD-10-CM | POA: Diagnosis not present

## 2023-06-07 DIAGNOSIS — Z09 Encounter for follow-up examination after completed treatment for conditions other than malignant neoplasm: Secondary | ICD-10-CM

## 2023-06-07 DIAGNOSIS — D120 Benign neoplasm of cecum: Secondary | ICD-10-CM | POA: Diagnosis not present

## 2023-06-07 DIAGNOSIS — Z1211 Encounter for screening for malignant neoplasm of colon: Secondary | ICD-10-CM | POA: Diagnosis not present

## 2023-06-07 DIAGNOSIS — K635 Polyp of colon: Secondary | ICD-10-CM | POA: Diagnosis not present

## 2023-06-07 DIAGNOSIS — I10 Essential (primary) hypertension: Secondary | ICD-10-CM | POA: Diagnosis not present

## 2023-06-07 MED ORDER — SODIUM CHLORIDE 0.9 % IV SOLN: 500.0000 mL | Freq: Once | INTRAVENOUS | Status: DC

## 2023-06-07 NOTE — Patient Instructions (Signed)
Discharge instructions given. Handout on polyps. Resume previous medications. YOU HAD AN ENDOSCOPIC PROCEDURE TODAY AT THE [facility]:   Refer to the procedure report that was given to you for any specific questions about what was found during the examination.  If the procedure report does not answer your questions, please call your gastroenterologist to clarify.  If you requested that your care partner not be given the details of your procedure findings, then the procedure report has been included in a sealed envelope for you to review at your convenience later.  YOU SHOULD EXPECT: Some feelings of bloating in the abdomen. Passage of more gas than usual.  Walking can help get rid of the air that was put into your GI tract during the procedure and reduce the bloating. If you had a lower endoscopy (such as a colonoscopy or flexible sigmoidoscopy) you may notice spotting of blood in your stool or on the toilet paper. If you underwent a bowel prep for your procedure, you may not have a normal bowel movement for a few days.  Please Note:  You might notice some irritation and congestion in your nose or some drainage.  This is from the oxygen used during your procedure.  There is no need for concern and it should clear up in a day or so.  SYMPTOMS TO REPORT IMMEDIATELY:  Following lower endoscopy (colonoscopy or flexible sigmoidoscopy):  Excessive amounts of blood in the stool  Significant tenderness or worsening of abdominal pains  Swelling of the abdomen that is new, acute  Fever of 100F or higher   For urgent or emergent issues, a gastroenterologist can be reached at any hour by calling [phone]. Do not use MyChart messaging for urgent concerns.    DIET:  We do recommend a small meal at first, but then you may proceed to your regular diet.  Drink plenty of fluids but you should avoid alcoholic beverages for 24 hours.  ACTIVITY:  You should plan to take it easy for the rest  of today and you should NOT DRIVE or use heavy machinery until tomorrow (because of the sedation medicines used during the test).    FOLLOW UP: Our staff will call the number listed on your records the next business day following your procedure.  We will call around 7:15- 8:00 am to check on you and address any questions or concerns that you may have regarding the information given to you following your procedure. If we do not reach you, we will leave a message.     If any biopsies were taken you will be contacted by phone or by letter within the next 1-3 weeks.  Please call us at [phone] if you have not heard about the biopsies in 3 weeks.    SIGNATURES/CONFIDENTIALITY: You and/or your care partner have signed paperwork which will be entered into your electronic medical record.  These signatures attest to the fact that that the information above on your After Visit Summary has been reviewed and is understood.  Full responsibility of the confidentiality of this discharge information lies with you and/or your care-partner. 

## 2023-06-07 NOTE — Op Note (Signed)
[facility] Patient Name: Maurice Hernandez Procedure Date: 06/07/2023 1:01 PM MRN: [id number] Endoscopist: Beverley Fiedler , MD, [id number] Age: 62 Referring MD:  Date of Birth: 1960/10/22 Gender: Male Account #: [id number] Procedure:                Colonoscopy Indications:              High risk colon cancer surveillance: Personal                            history of multiple adenomas, Last colonoscopy:                            June 2019 (TA x2), Jan 2016 (TA x 5) Medicines:                Monitored Anesthesia Care Procedure:                Pre-Anesthesia Assessment:                           - Prior to the procedure, a History and Physical                            was performed, and patient medications and                            allergies were reviewed. The patient's tolerance of                            previous anesthesia was also reviewed. The risks                            and benefits of the procedure and the sedation                            options and risks were discussed with the patient.                            All questions were answered, and informed consent                            was obtained. Prior Anticoagulants: The patient has                            taken no anticoagulant or antiplatelet agents. ASA                            Grade Assessment: II - A patient with mild systemic                            disease. After reviewing the risks and benefits,                            the patient was deemed in satisfactory condition to  undergo the procedure.                           After obtaining informed consent, the colonoscope                            was passed under direct vision. Throughout the                            procedure, the patient's blood pressure, pulse, and                            oxygen saturations were monitored continuously. The                            Olympus Scope SN [id number] was  introduced through the                            anus and advanced to the cecum, identified by                            appendiceal orifice and ileocecal valve. The                            colonoscopy was performed without difficulty. The                            patient tolerated the procedure well. The quality                            of the bowel preparation was good. The ileocecal                            valve, appendiceal orifice, and rectum were                            photographed. Scope In: 1:10:23 PM Scope Out: 1:23:43 PM Scope Withdrawal Time: 0 hours 11 minutes 10 seconds  Total Procedure Duration: 0 hours 13 minutes 20 seconds  Findings:                 The digital rectal exam was normal.                           A 5 mm polyp was found in the cecum. The polyp was                            sessile. The polyp was removed with a cold snare.                            Resection and retrieval were complete.                           The exam was otherwise without abnormality on  direct and retroflexion views. Complications:            No immediate complications. Estimated Blood Loss:     Estimated blood loss: none. Impression:               - One 5 mm polyp in the cecum, removed with a cold                            snare. Resected and retrieved.                           - The examination was otherwise normal on direct                            and retroflexion views. Recommendation:           - Patient has a contact number available for                            emergencies. The signs and symptoms of potential                            delayed complications were discussed with the                            patient. Return to normal activities tomorrow.                            Written discharge instructions were provided to the                            patient.                           - Resume previous diet.                            - Continue present medications.                           - Await pathology results.                           - Repeat colonoscopy is recommended for                            surveillance. The colonoscopy date will be                            determined after pathology results from today's                            exam become available for review. Beverley Fiedler, MD 06/07/2023 1:26:51 PM This report has been signed electronically.

## 2023-06-07 NOTE — Progress Notes (Signed)
Pt's states no medical or surgical changes since previsit or office visit. 

## 2023-06-07 NOTE — Progress Notes (Signed)
Called to room to assist during endoscopic procedure.  Patient ID and intended procedure confirmed with present staff. Received instructions for my participation in the procedure from the performing physician.  

## 2023-06-07 NOTE — Progress Notes (Signed)
GASTROENTEROLOGY PROCEDURE H&P NOTE   Primary Care Physician: Mliss Sax, MD    Reason for Procedure:  History of adenomatous colon polyps  Plan:    colonoscopy  Patient is appropriate for endoscopic procedure(s) in the ambulatory ([facility]) setting.  The nature of the procedure, as well as the risks, benefits, and alternatives were carefully and thoroughly reviewed with the patient. Ample time for discussion and questions allowed. The patient understood, was satisfied, and agreed to proceed.     HPI: Maurice Hernandez is a 62 y.o. male who presents for colonoscopy.  Medical history as below.  Tolerated the prep.  No recent chest pain or shortness of breath.  No abdominal pain today.  Past Medical History:  Diagnosis Date   Bell's palsy 2003   CAD (coronary artery disease)    NSTEMI 11-15-16   Hypertension    Myocardial infarction (HCC) 10/2016   Shingles 02/03/2021    Past Surgical History:  Procedure Laterality Date   INGUINAL HERNIA REPAIR Left 1968   INGUINAL HERNIA REPAIR Right 2001   WISDOM TOOTH EXTRACTION      Prior to Admission medications   Medication Sig Start Date End Date Taking? Authorizing Provider  aspirin EC 81 MG tablet Take 81 mg by mouth daily.   Yes [provider]  atorvastatin (LIPITOR) 40 MG tablet Take 1 tablet by mouth daily. 01/21/21  Yes [provider]  metoprolol succinate (TOPROL-XL) 50 MG 24 hr tablet Take 50 mg by mouth daily.   Yes [provider]  lisinopril (PRINIVIL,ZESTRIL) 10 MG tablet Take 10 mg by mouth daily. 11/16/16 05/10/23  [provider]  Multiple Vitamin (MULTI-VITAMINS) TABS Take 1 tablet by mouth daily.    [provider]  nitroGLYCERIN (NITROSTAT) 0.4 MG SL tablet Place 0.4 mg under the tongue every 5 (five) minutes as needed. 01/31/22   [provider]  polyethylene glycol powder (GLYCOLAX/MIRALAX) 17 GM/SCOOP powder Take by mouth daily as needed. Patient not  taking: Reported on 06/07/2023 08/26/22   [provider]  sildenafil (REVATIO) 20 MG tablet TAKE 1-3 TABLETS BY MOUTH DAILY 45 MINUTES PRIOR TO INTERCOURSE AS NEEDED 12/21/20   Mliss Sax, MD    Current Outpatient Medications  Medication Sig Dispense Refill   aspirin EC 81 MG tablet Take 81 mg by mouth daily.     atorvastatin (LIPITOR) 40 MG tablet Take 1 tablet by mouth daily.     metoprolol succinate (TOPROL-XL) 50 MG 24 hr tablet Take 50 mg by mouth daily.     lisinopril (PRINIVIL,ZESTRIL) 10 MG tablet Take 10 mg by mouth daily.     Multiple Vitamin (MULTI-VITAMINS) TABS Take 1 tablet by mouth daily.     nitroGLYCERIN (NITROSTAT) 0.4 MG SL tablet Place 0.4 mg under the tongue every 5 (five) minutes as needed.     polyethylene glycol powder (GLYCOLAX/MIRALAX) 17 GM/SCOOP powder Take by mouth daily as needed. (Patient not taking: Reported on 06/07/2023)     sildenafil (REVATIO) 20 MG tablet TAKE 1-3 TABLETS BY MOUTH DAILY 45 MINUTES PRIOR TO INTERCOURSE AS NEEDED 50 tablet 1   Current Facility-Administered Medications  Medication Dose Route Frequency Provider Last Rate Last Admin   0.9 %  sodium chloride infusion  500 mL Intravenous Once Deerica Waszak, Carie Caddy, MD        Allergies as of 06/07/2023   (No Known Allergies)    Family History  Problem Relation Age of Onset   COPD Mother  Non Smoker (81)    Hypertension Mother    Heart disease Father        CABG (3-4 vessel)    Hypertension Sister    Dementia Sister    Cancer Sister        Hodgkin's Disease (16); Breast Cancer (Bilateral Mastectomy)    Hypertension Sister    Alzheimer's disease Paternal Aunt    Alzheimer's disease Paternal Uncle    Stomach cancer Maternal Aunt    Colon cancer Neg Hx    Esophageal cancer Neg Hx    Rectal cancer Neg Hx    Diabetes Neg Hx     Social History   Socioeconomic History   Marital status: Divorced    Spouse name: Not on file   Number of children: Not on file   Years  of education: Not on file   Highest education level: Not on file  Occupational History   Occupation: Microbiologist: other - [employer]    Comment: [employer]  Tobacco Use   Smoking status: Never   Smokeless tobacco: Never  Vaping Use   Vaping status: Never Used  Substance and Sexual Activity   Alcohol use: Yes    Alcohol/week: 3.0 standard drinks of alcohol    Types: 3 Cans of beer per week    Comment: Ocasionally    Drug use: No   Sexual activity: Not Currently  Other Topics Concern   Not on file  Social History Narrative   Marital Status:  Divorced (x 3)    Children:  Son (1)    Pets: Dog (1)    Living Situation: Lives with son Engineer, building services Custody)     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)    Education:  Theatre manager)     Tobacco Use:  None    Alcohol Use:  2-3 x per week    Drug Use:  None     Diet:  Regular   Exercise:  Running (3 x per week)    Hobbies:  Traveling, Sports     Right Handed      Social Determinants of Health   Financial Resource Strain: Not on file  Food Insecurity: Not on file  Transportation Needs: Not on file  Physical Activity: Not on file  Stress: Not on file  Social Connections: Not on file  Intimate Partner Violence: Not on file    Physical Exam: Vital signs in last 24 hours: @BP  (!) 135/93   Pulse 63   Temp (!) 96.9 F (36.1 C) (Temporal)   Resp 17   Ht 6\' 1"  (1.854 m)   Wt 192 lb (87.1 kg)   SpO2 97%   BMI 25.33 kg/m  GEN: NAD EYE: Sclerae anicteric ENT: MMM CV: Non-tachycardic Pulm: CTA b/l GI: Soft, NT/ND NEURO:  Alert & Oriented x 3   Erick Blinks, MD [facility]  06/07/2023 1:07 PM

## 2023-06-07 NOTE — Progress Notes (Signed)
Uneventful anesthetic. Report to pacu rn. Vss. Care resumed by rn. 

## 2023-06-08 ENCOUNTER — Telehealth: Payer: Self-pay

## 2023-06-08 NOTE — Telephone Encounter (Signed)
Left message on follow up call. 

## 2023-06-12 LAB — SURGICAL PATHOLOGY

## 2023-06-13 ENCOUNTER — Encounter: Payer: Self-pay | Admitting: Internal Medicine

## 2023-06-15 ENCOUNTER — Telehealth: Payer: Self-pay | Admitting: Internal Medicine

## 2023-06-15 NOTE — Telephone Encounter (Signed)
Inbound call from patient, would like a follow up on pathology results.

## 2023-06-15 NOTE — Telephone Encounter (Signed)
The pt has been advised of the pathology letter. He is aware that a copy has been mailed and in My Chart. No further questions at this time.    NIR DELAGE                                                                                                         06/13/2023 [address] [place] [address]                                                                                                                              Dear Mr. Faupel,   The polyp removed from your colon was benign, but precancerous. This means that it had the potential to change into cancer over time had it not been removed.   I recommend you have a repeat colonoscopy in 5 years to determine if you have developed any new polyps and to screen for colorectal cancer.   If you develop any new rectal bleeding, abdominal pain or significant bowel habit changes, please contact our office at [phone] .   Please call us if you have persistent problems or have questions about your condition that have not been fully answered at this time.   Sincerely,     Beverley Fiedler, MD

## 2023-06-22 DIAGNOSIS — D1801 Hemangioma of skin and subcutaneous tissue: Secondary | ICD-10-CM | POA: Diagnosis not present

## 2023-06-22 DIAGNOSIS — L814 Other melanin hyperpigmentation: Secondary | ICD-10-CM | POA: Diagnosis not present

## 2023-06-22 DIAGNOSIS — Z85828 Personal history of other malignant neoplasm of skin: Secondary | ICD-10-CM | POA: Diagnosis not present

## 2023-06-22 DIAGNOSIS — L821 Other seborrheic keratosis: Secondary | ICD-10-CM | POA: Diagnosis not present

## 2023-07-11 ENCOUNTER — Encounter: Payer: Self-pay | Admitting: Internal Medicine

## 2023-07-11 ENCOUNTER — Ambulatory Visit: Payer: 59 | Admitting: Internal Medicine

## 2023-07-11 VITALS — BP 134/82 | HR 60 | Temp 97.9°F | Ht 73.0 in | Wt 209.0 lb

## 2023-07-11 DIAGNOSIS — I2510 Atherosclerotic heart disease of native coronary artery without angina pectoris: Secondary | ICD-10-CM | POA: Diagnosis not present

## 2023-07-11 DIAGNOSIS — J029 Acute pharyngitis, unspecified: Secondary | ICD-10-CM

## 2023-07-11 DIAGNOSIS — R001 Bradycardia, unspecified: Secondary | ICD-10-CM | POA: Diagnosis not present

## 2023-07-11 DIAGNOSIS — I10 Essential (primary) hypertension: Secondary | ICD-10-CM | POA: Diagnosis not present

## 2023-07-11 DIAGNOSIS — E782 Mixed hyperlipidemia: Secondary | ICD-10-CM | POA: Diagnosis not present

## 2023-07-11 LAB — POCT RAPID STREP A (OFFICE): Rapid Strep A Screen: NEGATIVE

## 2023-07-11 LAB — POC COVID19 BINAXNOW: SARS Coronavirus 2 Ag: NEGATIVE

## 2023-07-11 NOTE — Patient Instructions (Addendum)
Sore throat treatment:  Tylenol for pain  Warm salt water gargles  Throat lozenges  Chloraseptic throat spray    For nasal congestion: Use over-the-counter Astepro 2 nasal sprays on each side of the nose twice daily until better    Call if not gradually better over the next  10 days  Call anytime if the symptoms are severe, you have high fever, short of breath, chest pain

## 2023-07-11 NOTE — Progress Notes (Signed)
[facility] [address] [place] [address] Dept: [phone] Dept Fax: [phone]  Acute Care Office Visit  Subjective:   Maurice Hernandez 13-Jan-1961 07/11/2023  Chief Complaint  Patient presents with   Sore Throat    Started last night     HPI: Discussed the use of AI scribe software for clinical note transcription with the patient, who gave verbal consent to proceed.  History of Present Illness   The patient presents with a sore throat and difficulty swallowing that began 1 day ago, after a workout at a local gym. The discomfort is primarily on the left side and has remained steady since onset. They deny fever, chills, earache, cough, shortness of breath, and chest pain. They report some right-sided nasal congestion and a light headache, which was partially relieved by 500mg  of acetaminophen.       The following portions of the patient's history were reviewed and updated as appropriate: past medical history, past surgical history, family history, social history, allergies, medications, and problem list.   Patient Active Problem List   Diagnosis Date Noted   Dysesthesia of scalp 06/16/2020   Midline low back pain without sciatica 06/16/2020   Chest wall contusion, right, initial encounter 07/09/2019   Atypical nevi 06/28/2018   PCP NOTES >>>>>>>>>>>> 11/29/2016   CAD (coronary artery disease) 11/29/2016   High risk sexual behavior 11/23/2015   Anxiety state 06/17/2015   Potential exposure to STD 12/16/2014   Encounter for health maintenance examination with abnormal findings 12/10/2014   Plantar fasciitis of left foot 12/10/2014   Colon cancer screening 05/21/2014   Erectile dysfunction 08/24/2013   Other malaise and fatigue 08/24/2013   Essential hypertension, benign 07/20/2013   Screening for prostate cancer 07/20/2013   Routine general medical examination at a health care facility 07/20/2013   Past  Medical History:  Diagnosis Date   Bell's palsy 2003   CAD (coronary artery disease)    NSTEMI 11-15-16   Hypertension    Myocardial infarction (HCC) 10/2016   Shingles 02/03/2021   Past Surgical History:  Procedure Laterality Date   INGUINAL HERNIA REPAIR Left 1968   INGUINAL HERNIA REPAIR Right 2001   WISDOM TOOTH EXTRACTION     Family History  Problem Relation Age of Onset   COPD Mother        Non Smoker (34)    Hypertension Mother    Heart disease Father        CABG (3-4 vessel)    Hypertension Sister    Dementia Sister    Cancer Sister        Hodgkin's Disease (16); Breast Cancer (Bilateral Mastectomy)    Hypertension Sister    Alzheimer's disease Paternal Aunt    Alzheimer's disease Paternal Uncle    Stomach cancer Maternal Aunt    Colon cancer Neg Hx    Esophageal cancer Neg Hx    Rectal cancer Neg Hx    Diabetes Neg Hx     Current Outpatient Medications:    aspirin EC 81 MG tablet, Take 81 mg by mouth daily., Disp: , Rfl:    atorvastatin (LIPITOR) 40 MG tablet, Take 1 tablet by mouth daily., Disp: , Rfl:    metoprolol succinate (TOPROL-XL) 50 MG 24 hr tablet, Take 50 mg by mouth daily., Disp: , Rfl:    Multiple Vitamin (MULTI-VITAMINS) TABS, Take 1 tablet by mouth daily., Disp: , Rfl:    nitroGLYCERIN (NITROSTAT) 0.4 MG SL tablet, Place 0.4  mg under the tongue every 5 (five) minutes as needed., Disp: , Rfl:    polyethylene glycol powder (GLYCOLAX/MIRALAX) 17 GM/SCOOP powder, Take by mouth daily as needed., Disp: , Rfl:    sildenafil (REVATIO) 20 MG tablet, TAKE 1-3 TABLETS BY MOUTH DAILY 45 MINUTES PRIOR TO INTERCOURSE AS NEEDED, Disp: 50 tablet, Rfl: 1   lisinopril (PRINIVIL,ZESTRIL) 10 MG tablet, Take 10 mg by mouth daily., Disp: , Rfl:  No Known Allergies   ROS: A complete ROS was performed with pertinent positives/negatives noted in the HPI. The remainder of the ROS are negative.    Objective:   Today's Vitals   07/11/23 1350  BP: 134/82  Pulse: 60   Temp: 97.9 F (36.6 C)  TempSrc: Temporal  SpO2: 98%  Weight: 209 lb (94.8 kg)  Height: 6\' 1"  (1.854 m)    GENERAL: Well-appearing, in NAD. Well nourished.  SKIN: Pink, warm and dry. No rash. HEENT:    HEAD: Normocephalic, non-traumatic.  EYES: Conjunctive pink without exudate.  EARS: External ear w/o redness, swelling, masses, or lesions. EAC clear. TM's intact, translucent w/o bulging, appropriate landmarks visualized.  NOSE: Septum midline w/o deformity. Nares patent, mucosa pink and non-inflamed w/o drainage. No sinus tenderness.  THROAT: Uvula midline. Oropharynx erythematous with cobblestoning. Tonsils non-inflamed w/o exudate. Mucus membranes pink and moist.  NECK: Trachea midline. Full ROM w/o pain or tenderness. No lymphadenopathy.  RESPIRATORY: Chest wall symmetrical. Respirations even and non-labored. Breath sounds clear to auscultation bilaterally.  CARDIAC: S1, S2 present, regular rate and rhythm. Peripheral pulses 2+ bilaterally.  EXTREMITIES: Without edema.  PSYCH/MENTAL STATUS: Alert, oriented x 3. Cooperative, appropriate mood and affect.    Results for orders placed or performed in visit on 07/11/23  POCT rapid strep A  Result Value Ref Range   Rapid Strep A Screen Negative Negative  POC COVID-19  Result Value Ref Range   SARS Coronavirus 2 Ag Negative Negative      Assessment & Plan:  Assessment and Plan    1. Pharyngitis, unspecified etiology - POCT rapid strep A - POC COVID-19 Tylenol for pain  Warm salt water gargles  Throat lozenges  Chloraseptic throat spray  Use over-the-counter Astepro 2 nasal sprays on each side of the nose twice daily for any nasal congestion   No orders of the defined types were placed in this encounter.  Orders Placed This Encounter  Procedures   POCT rapid strep A   POC COVID-19    Order Specific Question:   Previously tested for COVID-19    Answer:   Yes    Order Specific Question:   Resident in a congregate  (group) care setting    Answer:   No    Order Specific Question:   Employed in healthcare setting    Answer:   Unknown   Lab Orders         POCT rapid strep A         POC COVID-19     No images are attached to the encounter or orders placed in the encounter.  Return if symptoms worsen or fail to improve.   Salvatore Decent, FNP

## 2023-07-17 ENCOUNTER — Ambulatory Visit: Payer: 59 | Admitting: Internal Medicine

## 2023-07-17 ENCOUNTER — Encounter: Payer: Self-pay | Admitting: Internal Medicine

## 2023-07-17 VITALS — BP 120/62 | HR 62 | Temp 98.1°F | Ht 73.0 in | Wt 205.4 lb

## 2023-07-17 DIAGNOSIS — J0140 Acute pansinusitis, unspecified: Secondary | ICD-10-CM | POA: Diagnosis not present

## 2023-07-17 MED ORDER — FLUTICASONE PROPIONATE 50 MCG/ACT NA SUSP: 2.0000 | Freq: Every day | NASAL | 2 refills | Status: DC

## 2023-07-17 MED ORDER — AMOXICILLIN-POT CLAVULANATE 875-125 MG PO TABS: 1.0000 | ORAL_TABLET | Freq: Two times a day (BID) | ORAL | 0 refills | Status: AC

## 2023-07-17 NOTE — Progress Notes (Signed)
[facility] [address] [place] [address] Dept: [phone] Dept Fax: [phone]  Acute Care Office Visit  Subjective:   Maurice Hernandez 03-11-1961 07/17/2023  Chief Complaint  Patient presents with   Nasal Congestion   Cough   Facial Pain   chest congestion    HPI: laithen adie is a 62 year old male who presents for worsening URI symptoms.  Patient was seen by provider on 07/11/2023 for complaint of sore throat and painful swallowing that had initially started 1 day ago prior to initial encounter.  Rapid strep and COVID test were negative.  Patient was advised of over-the-counter and supportive measures to take for symptoms.   Today, patient reports runny nose,sinus pressure, ear fullness, cough with chest congestion (1-2 episodes of blood tinged sputum).  Has taken benadryl with no relief.   Denies fever, CP, shortness of breath.       The following portions of the patient's history were reviewed and updated as appropriate: past medical history, past surgical history, family history, social history, allergies, medications, and problem list.   Patient Active Problem List   Diagnosis Date Noted   Dysesthesia of scalp 06/16/2020   Midline low back pain without sciatica 06/16/2020   Chest wall contusion, right, initial encounter 07/09/2019   Atypical nevi 06/28/2018   PCP NOTES >>>>>>>>>>>> 11/29/2016   CAD (coronary artery disease) 11/29/2016   High risk sexual behavior 11/23/2015   Anxiety state 06/17/2015   Potential exposure to STD 12/16/2014   Encounter for health maintenance examination with abnormal findings 12/10/2014   Plantar fasciitis of left foot 12/10/2014   Colon cancer screening 05/21/2014   Erectile dysfunction 08/24/2013   Other malaise and fatigue 08/24/2013   Essential hypertension, benign 07/20/2013   Screening for prostate cancer 07/20/2013   Routine general medical examination at a  health care facility 07/20/2013   Past Medical History:  Diagnosis Date   Bell's palsy 2003   CAD (coronary artery disease)    NSTEMI 11-15-16   Hypertension    Myocardial infarction (HCC) 10/2016   Shingles 02/03/2021   Past Surgical History:  Procedure Laterality Date   INGUINAL HERNIA REPAIR Left 1968   INGUINAL HERNIA REPAIR Right 2001   WISDOM TOOTH EXTRACTION     Family History  Problem Relation Age of Onset   COPD Mother        Non Smoker (67)    Hypertension Mother    Heart disease Father        CABG (3-4 vessel)    Hypertension Sister    Dementia Sister    Cancer Sister        Hodgkin's Disease (16); Breast Cancer (Bilateral Mastectomy)    Hypertension Sister    Alzheimer's disease Paternal Aunt    Alzheimer's disease Paternal Uncle    Stomach cancer Maternal Aunt    Colon cancer Neg Hx    Esophageal cancer Neg Hx    Rectal cancer Neg Hx    Diabetes Neg Hx     Current Outpatient Medications:    amoxicillin-clavulanate (AUGMENTIN) 875-125 MG tablet, Take 1 tablet by mouth 2 (two) times daily for 7 days., Disp: 14 tablet, Rfl: 0   aspirin EC 81 MG tablet, Take 81 mg by mouth daily., Disp: , Rfl:    atorvastatin (LIPITOR) 40 MG tablet, Take 1 tablet by mouth daily., Disp: , Rfl:    fluticasone (FLONASE) 50 MCG/ACT nasal spray, Place 2 sprays into both nostrils  daily., Disp: 11.1 mL, Rfl: 2   metoprolol succinate (TOPROL-XL) 50 MG 24 hr tablet, Take 50 mg by mouth daily., Disp: , Rfl:    Multiple Vitamin (MULTI-VITAMINS) TABS, Take 1 tablet by mouth daily., Disp: , Rfl:    nitroGLYCERIN (NITROSTAT) 0.4 MG SL tablet, Place 0.4 mg under the tongue every 5 (five) minutes as needed., Disp: , Rfl:    polyethylene glycol powder (GLYCOLAX/MIRALAX) 17 GM/SCOOP powder, Take by mouth daily as needed., Disp: , Rfl:    sildenafil (REVATIO) 20 MG tablet, TAKE 1-3 TABLETS BY MOUTH DAILY 45 MINUTES PRIOR TO INTERCOURSE AS NEEDED, Disp: 50 tablet, Rfl: 1   lisinopril  (PRINIVIL,ZESTRIL) 10 MG tablet, Take 10 mg by mouth daily., Disp: , Rfl:  No Known Allergies   ROS: A complete ROS was performed with pertinent positives/negatives noted in the HPI. The remainder of the ROS are negative.    Objective:   Today's Vitals   07/17/23 1054  BP: 120/62  Pulse: 62  Temp: 98.1 F (36.7 C)  TempSrc: Temporal  SpO2: 96%  Weight: 205 lb 6.4 oz (93.2 kg)  Height: 6\' 1"  (1.854 m)    GENERAL: Well-appearing, in NAD. Well nourished.  SKIN: Pink, warm and dry. No rash. HEENT:    HEAD: Normocephalic, non-traumatic.  EYES: Conjunctive pink without exudate.  EARS: External ear w/o redness, swelling, masses, or lesions. EAC clear. TM's intact, erythematous w/o bulging, appropriate landmarks visualized.  NOSE: Septum midline w/o deformity. Nares patent, mucosa pink and non-inflamed w/o drainage. (+) sinus tenderness.  THROAT: Uvula midline. Oropharynx erythematous. Tonsils non-inflamed w/o exudate. Mucus membranes pink and moist.  NECK: Trachea midline. Full ROM w/o pain or tenderness. No lymphadenopathy.  RESPIRATORY: Chest wall symmetrical. Respirations even and non-labored. Breath sounds clear to auscultation bilaterally.  CARDIAC: S1, S2 present, regular rate and rhythm. Peripheral pulses 2+ bilaterally.  EXTREMITIES: Without clubbing, cyanosis, or edema.  PSYCH/MENTAL STATUS: Alert, oriented x 3. Cooperative, appropriate mood and affect.    No results found for any visits on 07/17/23.    Assessment & Plan:   Acute non-recurrent pansinusitis -     Amoxicillin-Pot Clavulanate; Take 1 tablet by mouth 2 (two) times daily for 7 days.  Dispense: 14 tablet; Refill: 0 -     Fluticasone Propionate; Place 2 sprays into both nostrils daily.  Dispense: 11.1 mL; Refill: 2   Meds ordered this encounter  Medications   amoxicillin-clavulanate (AUGMENTIN) 875-125 MG tablet    Sig: Take 1 tablet by mouth 2 (two) times daily for 7 days.    Dispense:  14 tablet     Refill:  0    Order Specific Question:   Supervising Provider    Answer:   Garnette Gunner [[id number]]   fluticasone (FLONASE) 50 MCG/ACT nasal spray    Sig: Place 2 sprays into both nostrils daily.    Dispense:  11.1 mL    Refill:  2    Order Specific Question:   Supervising Provider    Answer:   Garnette Gunner [[id number]]   No orders of the defined types were placed in this encounter.  Lab Orders  No laboratory test(s) ordered today   No images are attached to the encounter or orders placed in the encounter.  Return if symptoms worsen or fail to improve.   Salvatore Decent, FNP

## 2023-07-28 ENCOUNTER — Ambulatory Visit (INDEPENDENT_AMBULATORY_CARE_PROVIDER_SITE_OTHER): Payer: 59 | Admitting: Family Medicine

## 2023-07-28 ENCOUNTER — Encounter: Payer: Self-pay | Admitting: Family Medicine

## 2023-07-28 VITALS — BP 134/84 | HR 55 | Temp 97.6°F | Ht 73.0 in | Wt 210.0 lb

## 2023-07-28 DIAGNOSIS — Z0000 Encounter for general adult medical examination without abnormal findings: Secondary | ICD-10-CM

## 2023-07-28 DIAGNOSIS — Z114 Encounter for screening for human immunodeficiency virus [HIV]: Secondary | ICD-10-CM

## 2023-07-28 DIAGNOSIS — Z125 Encounter for screening for malignant neoplasm of prostate: Secondary | ICD-10-CM

## 2023-07-28 DIAGNOSIS — Z131 Encounter for screening for diabetes mellitus: Secondary | ICD-10-CM | POA: Diagnosis not present

## 2023-07-28 DIAGNOSIS — E7800 Pure hypercholesterolemia, unspecified: Secondary | ICD-10-CM

## 2023-07-28 DIAGNOSIS — I10 Essential (primary) hypertension: Secondary | ICD-10-CM | POA: Diagnosis not present

## 2023-07-28 LAB — COMPREHENSIVE METABOLIC PANEL
ALT: 35 U/L (ref 0–53)
AST: 31 U/L (ref 0–37)
Albumin: 4.1 g/dL (ref 3.5–5.2)
Alkaline Phosphatase: 70 U/L (ref 39–117)
BUN: 21 mg/dL (ref 6–23)
CO2: 31 meq/L (ref 19–32)
Calcium: 9.2 mg/dL (ref 8.4–10.5)
Chloride: 102 meq/L (ref 96–112)
Creatinine, Ser: 1.05 mg/dL (ref 0.40–1.50)
GFR: 77.29 mL/min (ref >60.00)
Glucose, Bld: 91 mg/dL (ref 70–99)
Potassium: 4.3 meq/L (ref 3.5–5.1)
Sodium: 137 meq/L (ref 135–145)
Total Bilirubin: 0.7 mg/dL (ref 0.2–1.2)
Total Protein: 6.5 g/dL (ref 6.0–8.3)

## 2023-07-28 LAB — URINALYSIS, ROUTINE W REFLEX MICROSCOPIC
Bilirubin Urine: NEGATIVE
Hgb urine dipstick: NEGATIVE
Ketones, ur: NEGATIVE
Leukocytes,Ua: NEGATIVE
Nitrite: NEGATIVE
RBC / HPF: NONE SEEN (ref 0–?)
Specific Gravity, Urine: <=1.005 — AB (ref 1.000–1.030)
Total Protein, Urine: NEGATIVE
Urine Glucose: NEGATIVE
Urobilinogen, UA: 0.2 (ref 0.0–1.0)
pH: 6 (ref 5.0–8.0)

## 2023-07-28 LAB — CBC WITH DIFFERENTIAL/PLATELET
Basophils Absolute: 0.1 10*3/uL (ref 0.0–0.1)
Basophils Relative: 1 % (ref 0.0–3.0)
Eosinophils Absolute: 0.1 10*3/uL (ref 0.0–0.7)
Eosinophils Relative: 2 % (ref 0.0–5.0)
HCT: 43.7 % (ref 39.0–52.0)
Hemoglobin: 15.2 g/dL (ref 13.0–17.0)
Lymphocytes Relative: 31.5 % (ref 12.0–46.0)
Lymphs Abs: 1.7 10*3/uL (ref 0.7–4.0)
MCHC: 34.9 g/dL (ref 30.0–36.0)
MCV: 93.5 fL (ref 78.0–100.0)
Monocytes Absolute: 0.4 10*3/uL (ref 0.1–1.0)
Monocytes Relative: 7.8 % (ref 3.0–12.0)
Neutro Abs: 3.1 10*3/uL (ref 1.4–7.7)
Neutrophils Relative %: 57.7 % (ref 43.0–77.0)
Platelets: 245 10*3/uL (ref 150.0–400.0)
RBC: 4.67 Mil/uL (ref 4.22–5.81)
RDW: 12.6 % (ref 11.5–15.5)
WBC: 5.4 10*3/uL (ref 4.0–10.5)

## 2023-07-28 LAB — LIPID PANEL
Cholesterol: 131 mg/dL (ref 0–200)
HDL: 33.1 mg/dL — ABNORMAL LOW (ref >39.00)
LDL Cholesterol: 71 mg/dL (ref 0–99)
NonHDL: 97.86
Total CHOL/HDL Ratio: 4
Triglycerides: 133 mg/dL (ref 0.0–149.0)
VLDL: 26.6 mg/dL (ref 0.0–40.0)

## 2023-07-28 LAB — HEMOGLOBIN A1C: Hgb A1c MFr Bld: 5.9 % (ref 4.6–6.5)

## 2023-07-28 LAB — PSA: PSA: 0.77 ng/mL (ref 0.10–4.00)

## 2023-07-28 NOTE — Progress Notes (Signed)
Established Patient Office Visit   Subjective:  Patient ID: Maurice Hernandez, male    DOB: 02/13/1961  Age: 62 y.o. MRN: [id number]  Chief Complaint  Patient presents with   Annual Exam    CPE. Pt is fasting.     HPI Encounter Diagnoses  Name Primary?   Healthcare maintenance Yes   Screening for prostate cancer    Essential hypertension    Elevated cholesterol    Screening for HIV (human immunodeficiency virus)    Screening for diabetes mellitus    Here for yearly physical and follow-up of above.  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  He is exercising regularly by walking.  He does have regular dental care.  Continues atorvastatin 40 for history of elevated cholesterol with history of CAD.  He has had no chest pain or shortness of breath.  Blood pressure controlled well with metoprolol XL 50, lisinopril 10.  Up-to-date on health maintenance.   Review of Systems  Constitutional: Negative.   HENT: Negative.    Eyes:  Negative for blurred vision, discharge and redness.  Respiratory: Negative.    Cardiovascular: Negative.   Gastrointestinal:  Negative for abdominal pain.  Genitourinary: Negative.   Musculoskeletal: Negative.  Negative for myalgias.  Skin:  Negative for rash.  Neurological:  Negative for tingling, loss of consciousness and weakness.  Endo/Heme/Allergies:  Negative for polydipsia.     Current Outpatient Medications:    aspirin EC 81 MG tablet, Take 81 mg by mouth daily., Disp: , Rfl:    atorvastatin (LIPITOR) 40 MG tablet, Take 1 tablet by mouth daily., Disp: , Rfl:    fluticasone (FLONASE) 50 MCG/ACT nasal spray, Place 2 sprays into both nostrils daily., Disp: 11.1 mL, Rfl: 2   metoprolol succinate (TOPROL-XL) 50 MG 24 hr tablet, Take 50 mg by mouth daily., Disp: , Rfl:    Multiple Vitamin (MULTI-VITAMINS) TABS, Take 1 tablet by mouth daily., Disp: , Rfl:    nitroGLYCERIN (NITROSTAT) 0.4 MG SL tablet, Place 0.4 mg under the tongue every 5 (five)  minutes as needed., Disp: , Rfl:    polyethylene glycol powder (GLYCOLAX/MIRALAX) 17 GM/SCOOP powder, Take by mouth daily as needed., Disp: , Rfl:    sildenafil (REVATIO) 20 MG tablet, TAKE 1-3 TABLETS BY MOUTH DAILY 45 MINUTES PRIOR TO INTERCOURSE AS NEEDED, Disp: 50 tablet, Rfl: 1   lisinopril (PRINIVIL,ZESTRIL) 10 MG tablet, Take 10 mg by mouth daily., Disp: , Rfl:    Objective:     BP 134/84   Pulse (!) 55   Temp 97.6 F (36.4 C)   Ht 6\' 1"  (1.854 m)   Wt 210 lb (95.3 kg)   SpO2 98%   BMI 27.71 kg/m  BP Readings from Last 3 Encounters:  07/28/23 134/84  07/17/23 120/62  07/11/23 134/82   Wt Readings from Last 3 Encounters:  07/28/23 210 lb (95.3 kg)  07/17/23 205 lb 6.4 oz (93.2 kg)  07/11/23 209 lb (94.8 kg)      Physical Exam Constitutional:      General: He is not in acute distress.    Appearance: Normal appearance. He is not ill-appearing, toxic-appearing or diaphoretic.  HENT:     Head: Normocephalic and atraumatic.     Right Ear: Tympanic membrane, ear canal and external ear normal.     Left Ear: Tympanic membrane, ear canal and external ear normal.     Mouth/Throat:     Mouth: Mucous membranes are moist.  Pharynx: Oropharynx is clear. No oropharyngeal exudate or posterior oropharyngeal erythema.  Eyes:     General: No scleral icterus.       Right eye: No discharge.        Left eye: No discharge.     Extraocular Movements: Extraocular movements intact.     Conjunctiva/sclera: Conjunctivae normal.     Pupils: Pupils are equal, round, and reactive to light.  Cardiovascular:     Rate and Rhythm: Normal rate and regular rhythm.  Pulmonary:     Effort: Pulmonary effort is normal. No respiratory distress.     Breath sounds: Normal breath sounds. No wheezing, rhonchi or rales.  Abdominal:     General: Bowel sounds are normal.     Tenderness: There is no abdominal tenderness. There is no guarding or rebound.     Hernia: There is no hernia in the left  inguinal area or right inguinal area.  Genitourinary:    Penis: Circumcised. No hypospadias, erythema, tenderness, discharge, swelling or lesions.      Testes:        Right: Mass, tenderness or swelling not present. Right testis is descended.        Left: Mass, tenderness or swelling not present. Left testis is descended.     Epididymis:     Right: Not inflamed or enlarged.     Left: Not inflamed or enlarged.  Musculoskeletal:     Cervical back: No rigidity or tenderness.  Lymphadenopathy:     Lower Body: No right inguinal adenopathy. No left inguinal adenopathy.  Skin:    General: Skin is warm and dry.  Neurological:     Mental Status: He is alert and oriented to person, place, and time.  Psychiatric:        Mood and Affect: Mood normal.        Behavior: Behavior normal.      No results found for any visits on 07/28/23.    The ASCVD Risk score (Arnett DK, et al., 2019) failed to calculate for the following reasons:   The patient has a prior MI or stroke diagnosis    Assessment & Plan:   Healthcare maintenance -     CBC with Differential/Platelet -     Urinalysis, Routine w reflex microscopic  Screening for prostate cancer -     PSA  Essential hypertension -     CBC with Differential/Platelet -     Comprehensive metabolic panel  Elevated cholesterol -     Comprehensive metabolic panel -     Lipid panel  Screening for HIV (human immunodeficiency virus) -     HIV Antibody (routine testing w rflx)  Screening for diabetes mellitus -     Hemoglobin A1c    No follow-ups on file.    Mliss Sax, MD

## 2023-07-29 LAB — HIV ANTIBODY (ROUTINE TESTING W REFLEX): HIV 1&2 Ab, 4th Generation: NONREACTIVE

## 2023-08-18 ENCOUNTER — Other Ambulatory Visit: Payer: Self-pay

## 2023-08-18 MED ORDER — LISINOPRIL 10 MG PO TABS: 10.0000 mg | ORAL_TABLET | Freq: Every day | ORAL | 1 refills | Status: DC

## 2023-08-18 NOTE — Telephone Encounter (Signed)
LR  hx provider LOV  07/28/23 FOV   none scheduled.   Please review and advise.  Thanks.  Ian Bushman, cma   Copied from CRM [id number]. Topic: Clinical - Medication Refill >> Aug 18, 2023 10:18 AM Donita Brooks wrote: Most Recent Primary Care Visit:  Provider: Mliss Sax  Department: [facility]  Visit Type: PHYSICAL  Date: 07/28/2023  Medication: lisinopril (PRINIVIL,ZESTRIL) 10 MG tablet (Expired)   Has the patient contacted their pharmacy? Yes (Agent: If no, request that the patient contact the pharmacy for the refill. If patient does not wish to contact the pharmacy document the reason why and proceed with request.) (Agent: If yes, when and what did the pharmacy advise?)  Is this the correct pharmacy for this prescription? Yes If no, delete pharmacy and type the correct one.  This is the patient's preferred pharmacy:  [id number] [place], [place] - [address] [place] [address] Phone: [phone] Fax: [phone]  [id number], [place] - [address] [place] [address] Phone: [phone] Fax: [phone]   Has the prescription been filled recently? No  Is the patient out of the medication? No  Has the patient been seen for an appointment in the last year OR does the patient have an upcoming appointment? No  Can we respond through MyChart? Yes  Agent: Please be advised that Rx refills may take up to 3 business days. We ask that you follow-up with your pharmacy.

## 2023-12-01 ENCOUNTER — Other Ambulatory Visit: Payer: Self-pay | Admitting: Family Medicine

## 2023-12-28 DIAGNOSIS — L738 Other specified follicular disorders: Secondary | ICD-10-CM | POA: Diagnosis not present

## 2023-12-28 DIAGNOSIS — L722 Steatocystoma multiplex: Secondary | ICD-10-CM | POA: Diagnosis not present

## 2023-12-28 DIAGNOSIS — D229 Melanocytic nevi, unspecified: Secondary | ICD-10-CM | POA: Diagnosis not present

## 2023-12-28 DIAGNOSIS — L814 Other melanin hyperpigmentation: Secondary | ICD-10-CM | POA: Diagnosis not present

## 2024-05-01 DIAGNOSIS — L570 Actinic keratosis: Secondary | ICD-10-CM | POA: Diagnosis not present

## 2024-05-01 DIAGNOSIS — L821 Other seborrheic keratosis: Secondary | ICD-10-CM | POA: Diagnosis not present

## 2024-05-01 DIAGNOSIS — W908XXS Exposure to other nonionizing radiation, sequela: Secondary | ICD-10-CM | POA: Diagnosis not present

## 2024-05-01 DIAGNOSIS — L578 Other skin changes due to chronic exposure to nonionizing radiation: Secondary | ICD-10-CM | POA: Diagnosis not present

## 2024-05-08 ENCOUNTER — Ambulatory Visit: Payer: Self-pay | Admitting: Nurse Practitioner

## 2024-05-08 ENCOUNTER — Ambulatory Visit: Admitting: Nurse Practitioner

## 2024-05-08 ENCOUNTER — Ambulatory Visit: Payer: Self-pay | Admitting: *Deleted

## 2024-05-08 ENCOUNTER — Encounter: Payer: Self-pay | Admitting: Nurse Practitioner

## 2024-05-08 VITALS — BP 124/72 | HR 64 | Temp 98.4°F | Ht 72.0 in | Wt 192.8 lb

## 2024-05-08 DIAGNOSIS — R739 Hyperglycemia, unspecified: Secondary | ICD-10-CM | POA: Diagnosis not present

## 2024-05-08 DIAGNOSIS — R5383 Other fatigue: Secondary | ICD-10-CM

## 2024-05-08 LAB — CBC WITH DIFFERENTIAL/PLATELET
Basophils Absolute: 0 K/uL (ref 0.0–0.1)
Basophils Relative: 0.4 % (ref 0.0–3.0)
Eosinophils Absolute: 0 K/uL (ref 0.0–0.7)
Eosinophils Relative: 0.6 % (ref 0.0–5.0)
HCT: 41.3 % (ref 39.0–52.0)
Hemoglobin: 13.8 g/dL (ref 13.0–17.0)
Lymphocytes Relative: 10 % — ABNORMAL LOW (ref 12.0–46.0)
Lymphs Abs: 0.7 K/uL (ref 0.7–4.0)
MCHC: 33.5 g/dL (ref 30.0–36.0)
MCV: 93 fl (ref 78.0–100.0)
Monocytes Absolute: 0.9 K/uL (ref 0.1–1.0)
Monocytes Relative: 13 % — ABNORMAL HIGH (ref 3.0–12.0)
Neutro Abs: 5 K/uL (ref 1.4–7.7)
Neutrophils Relative %: 76 % (ref 43.0–77.0)
Platelets: 140 K/uL — ABNORMAL LOW (ref 150.0–400.0)
RBC: 4.44 Mil/uL (ref 4.22–5.81)
RDW: 12.9 % (ref 11.5–15.5)
WBC: 6.6 K/uL (ref 4.0–10.5)

## 2024-05-08 LAB — POCT URINALYSIS DIPSTICK
Bilirubin, UA: NEGATIVE
Blood, UA: NEGATIVE
Glucose, UA: NEGATIVE
Ketones, UA: NEGATIVE
Leukocytes, UA: NEGATIVE
Nitrite, UA: NEGATIVE
Protein, UA: NEGATIVE
Spec Grav, UA: 1.025 (ref 1.010–1.025)
Urobilinogen, UA: NEGATIVE U/dL — AB
pH, UA: 6 (ref 5.0–8.0)

## 2024-05-08 LAB — COMPREHENSIVE METABOLIC PANEL WITH GFR
ALT: 21 U/L (ref 0–53)
AST: 26 U/L (ref 0–37)
Albumin: 4.3 g/dL (ref 3.5–5.2)
Alkaline Phosphatase: 60 U/L (ref 39–117)
BUN: 17 mg/dL (ref 6–23)
CO2: 31 meq/L (ref 19–32)
Calcium: 9.4 mg/dL (ref 8.4–10.5)
Chloride: 101 meq/L (ref 96–112)
Creatinine, Ser: 1.14 mg/dL (ref 0.40–1.50)
GFR: 69.64 mL/min (ref >60.00)
Glucose, Bld: 94 mg/dL (ref 70–99)
Potassium: 3.9 meq/L (ref 3.5–5.1)
Sodium: 138 meq/L (ref 135–145)
Total Bilirubin: 1.1 mg/dL (ref 0.2–1.2)
Total Protein: 6.8 g/dL (ref 6.0–8.3)

## 2024-05-08 LAB — TSH: TSH: 2.52 u[IU]/mL (ref 0.35–5.50)

## 2024-05-08 LAB — POC COVID19 BINAXNOW: SARS Coronavirus 2 Ag: NEGATIVE

## 2024-05-08 NOTE — Patient Instructions (Signed)
 Go to lab [facility] to use melatonin or benadryl for restless sleep.

## 2024-05-08 NOTE — Progress Notes (Signed)
 Acute Office Visit  Subjective:    Patient ID: Maurice Hernandez, male    DOB: 07/17/1961, 63 y.o.   MRN: [id number]  Chief Complaint  Patient presents with   Fatigue    Fatigue started Monday afternoon/evening headache and lack of sleep    HPI Patient is in today with Fatigue x 3days, associated with headache and interrupted sleep. Able to fall asleep but premature waking and unable to return to sleep.  Weekend activity: walk, attended a show, and had 2alcoholic drinks. Denies any upper respiratory symptoms or GU/GI symptoms, or any rash  Outpatient Medications Prior to Visit  Medication Sig   aspirin EC 81 MG tablet Take 81 mg by mouth daily.   atorvastatin (LIPITOR) 40 MG tablet Take 1 tablet by mouth daily.   lisinopril  (ZESTRIL ) 10 MG tablet TAKE 1 TABLET BY MOUTH EVERY DAY   metoprolol succinate (TOPROL-XL) 50 MG 24 hr tablet Take 50 mg by mouth daily.   Multiple Vitamin (MULTI-VITAMINS) TABS Take 1 tablet by mouth daily.   nitroGLYCERIN (NITROSTAT) 0.4 MG SL tablet Place 0.4 mg under the tongue every 5 (five) minutes as needed.   polyethylene glycol powder (GLYCOLAX/MIRALAX) 17 GM/SCOOP powder Take by mouth daily as needed.   sildenafil  (REVATIO ) 20 MG tablet TAKE 1-3 TABLETS BY MOUTH DAILY 45 MINUTES PRIOR TO INTERCOURSE AS NEEDED   [DISCONTINUED] fluticasone  (FLONASE ) 50 MCG/ACT nasal spray Place 2 sprays into both nostrils daily. (Patient not taking: Reported on 05/08/2024)   No facility-administered medications prior to visit.    Reviewed past medical and social history.  Review of Systems Per HPI     Objective:    Physical Exam Vitals and nursing note reviewed.  Constitutional:      General: He is not in acute distress.    Appearance: He is not ill-appearing.  HENT:     Right Ear: Tympanic membrane, ear canal and external ear normal.     Left Ear: Tympanic membrane, ear canal and external ear normal.     Nose: Nose normal.     Mouth/Throat:     Mouth:  Mucous membranes are moist.     Pharynx: No posterior oropharyngeal erythema.  Eyes:     Extraocular Movements: Extraocular movements intact.     Conjunctiva/sclera: Conjunctivae normal.  Cardiovascular:     Rate and Rhythm: Normal rate and regular rhythm.     Pulses: Normal pulses.     Heart sounds: Normal heart sounds.  Pulmonary:     Effort: Pulmonary effort is normal.     Breath sounds: Normal breath sounds.  Abdominal:     General: Bowel sounds are normal.     Palpations: Abdomen is soft.  Musculoskeletal:     Cervical back: Normal range of motion and neck supple.     Right lower leg: No edema.     Left lower leg: No edema.  Lymphadenopathy:     Cervical: No cervical adenopathy.  Skin:    Findings: No rash.  Neurological:     Mental Status: He is alert and oriented to person, place, and time.  Psychiatric:        Mood and Affect: Mood normal.        Behavior: Behavior normal.        Thought Content: Thought content normal.     BP 124/72 (BP Location: Left Arm, Patient Position: Sitting, Cuff Size: Large)   Pulse 64   Temp 98.4 F (36.9 C) (Oral)   Ht  6' (1.829 m)   Wt 192 lb 12.8 oz (87.5 kg)   SpO2 96%   BMI 26.15 kg/m    Results for orders placed or performed in visit on 05/08/24  POC COVID-19  Result Value Ref Range   SARS Coronavirus 2 Ag Negative Negative  POCT urinalysis dipstick  Result Value Ref Range   Color, UA Yellow    Clarity, UA Clear    Glucose, UA Negative Negative   Bilirubin, UA Negative    Ketones, UA Negative    Spec Grav, UA 1.025 1.010 - 1.025   Blood, UA Negative    pH, UA 6.0 5.0 - 8.0   Protein, UA Negative Negative   Urobilinogen, UA negative (A) 0.2 or 1.0 E.U./dL   Nitrite, UA Negative    Leukocytes, UA Negative Negative   Appearance     Odor No       Assessment & Plan:   Problem List Items Addressed This Visit   None Visit Diagnoses       Fatigue, unspecified type    -  Primary   Relevant Orders   CBC with  Differential/Platelet   Comprehensive metabolic panel with GFR   TSH   POC COVID-19 (Completed)   POCT urinalysis dipstick (Completed)     Hyperglycemia       Relevant Orders   Hemoglobin A1c      No orders of the defined types were placed in this encounter.  Return if symptoms worsen or fail to improve.  Roselie Mood, NP

## 2024-05-08 NOTE — Telephone Encounter (Signed)
 FYI Only or Action Required?: FYI only for provider.  Patient was last seen in primary care on 07/28/2023 by Berneta Elsie Sayre, MD.  Called Nurse Triage reporting Fatigue.  Symptoms began yesterday.  Interventions attempted: Rest, hydration, or home remedies.  Symptoms are: gradually worsening.  Triage Disposition: See HCP Within 4 Hours (Or PCP Triage)  Patient/caregiver understands and will follow disposition?: Yes   Reason for Disposition  [1] MODERATE weakness (e.g., interferes with work, school, normal activities) AND [2] cause unknown  (Exceptions: Weakness from acute minor illness or poor fluid intake; weakness is chronic and not worse.)  Answer Assessment - Initial Assessment Questions 1. DESCRIPTION: Describe how you are feeling.     Low energy, fatigue 2. SEVERITY: How bad is it?  Can you stand and walk?     Patient has notable lack of energy- having to push himself 3. ONSET: When did these symptoms begin? (e.g., hours, days, weeks, months)     Monday afternoon 4. CAUSE: What do you think is causing the weakness or fatigue? (e.g., not drinking enough fluids, medical problem, trouble sleeping)     unsure 5. NEW MEDICINES:  Have you started on any new medicines recently? (e.g., opioid pain medicines, benzodiazepines, muscle relaxants, antidepressants, antihistamines, neuroleptics, beta blockers)     No changes 6. OTHER SYMPTOMS: Do you have any other symptoms? (e.g., chest pain, fever, cough, SOB, vomiting, diarrhea, bleeding, other areas of pain)     no  Protocols used: Weakness (Generalized) and Fatigue-A-AH  Copied from CRM #[id number]. Topic: Clinical - Red Word Triage >> May 08, 2024  8:04 AM Adelita E wrote: [facility] that prompted transfer to Nurse Triage: Low energy, along with increasing fatigue. Symptoms started this past Monday.

## 2024-05-10 LAB — HEMOGLOBIN A1C: Hgb A1c MFr Bld: 5.7 % (ref 4.6–6.5)

## 2024-05-29 ENCOUNTER — Other Ambulatory Visit: Payer: Self-pay | Admitting: Family Medicine

## 2024-06-13 DIAGNOSIS — Z0110 Encounter for examination of ears and hearing without abnormal findings: Secondary | ICD-10-CM | POA: Diagnosis not present

## 2024-07-01 DIAGNOSIS — N529 Male erectile dysfunction, unspecified: Secondary | ICD-10-CM | POA: Diagnosis not present

## 2024-07-01 DIAGNOSIS — R351 Nocturia: Secondary | ICD-10-CM | POA: Diagnosis not present

## 2024-07-20 ENCOUNTER — Other Ambulatory Visit: Payer: Self-pay | Admitting: Family Medicine

## 2024-08-06 ENCOUNTER — Other Ambulatory Visit: Payer: Self-pay | Admitting: Family Medicine

## 2024-08-06 DIAGNOSIS — N5201 Erectile dysfunction due to arterial insufficiency: Secondary | ICD-10-CM

## 2024-08-06 NOTE — Telephone Encounter (Signed)
 Requesting: sildenafil  (REVATIO ) 20 MG tablet Last Visit: 07/20/2024 Next Visit: Visit date not found Last Refill: 12/2020  Please Advise

## 2024-08-06 NOTE — Telephone Encounter (Unsigned)
 Copied from CRM #[id number]. Topic: Clinical - Medication Refill >> Aug 06, 2024 12:53 PM Burnard DEL wrote: Medication: sildenafil  (REVATIO ) 20 MG tablet  Has the patient contacted their pharmacy? No (Agent: If no, request that the patient contact the pharmacy for the refill. If patient does not wish to contact the pharmacy document the reason why and proceed with request.) (Agent: If yes, when and what did the pharmacy advise?)  This is the patient's preferred pharmacy:    [facility] [id number] [place], [place] - [address] [place] [address] Phone: [phone] Fax: [phone]  Is this the correct pharmacy for this prescription? Yes If no, delete pharmacy and type the correct one.   Has the prescription been filled recently? No  Is the patient out of the medication? Yes  Has the patient been seen for an appointment in the last year OR does the patient have an upcoming appointment? Yes  Can we respond through MyChart? Yes  Agent: Please be advised that Rx refills may take up to 3 business days. We ask that you follow-up with your pharmacy.

## 2024-08-08 MED ORDER — SILDENAFIL CITRATE 20 MG PO TABS: ORAL_TABLET | ORAL | 1 refills | Status: AC

## 2024-09-05 ENCOUNTER — Telehealth: Payer: Self-pay

## 2024-09-05 NOTE — Telephone Encounter (Signed)
 Patient called and informed that the labs for his physical will be ordered/placed at the time of his appointment not before. He stated that he just wanted to make sure that the labs would be down since he will be fasting. Informed patient that if he comes not fasting and then Dr. Berneta will discuss with him about returning on another day for labs when he is fasting. He thanked me for calling and all if any questions were answered.

## 2024-09-05 NOTE — Telephone Encounter (Signed)
 Copied from CRM [id number]. Topic: Clinical - Request for Lab/Test Order >> Sep 04, 2024  4:54 PM Nessti S wrote: Reason for CRM: pt wanted to order labs for physical appt 10/25/24

## 2024-09-25 ENCOUNTER — Other Ambulatory Visit: Payer: Self-pay | Admitting: Family Medicine

## 2024-10-25 ENCOUNTER — Encounter: Admitting: Family Medicine
